# Patient Record
Sex: Female | Born: 1940 | Race: White | Hispanic: No | Marital: Married | Smoking: Never smoker
Health system: Southern US, Community
[De-identification: ages and names within clinical notes are randomized; demographics above are authoritative.]

## PROBLEM LIST (undated history)

## (undated) DIAGNOSIS — K5000 Crohn's disease of small intestine without complications: Secondary | ICD-10-CM

## (undated) DIAGNOSIS — R011 Cardiac murmur, unspecified: Secondary | ICD-10-CM

## (undated) DIAGNOSIS — Z9289 Personal history of other medical treatment: Secondary | ICD-10-CM

## (undated) DIAGNOSIS — I639 Cerebral infarction, unspecified: Secondary | ICD-10-CM

## (undated) DIAGNOSIS — R42 Dizziness and giddiness: Secondary | ICD-10-CM

## (undated) DIAGNOSIS — C439 Malignant melanoma of skin, unspecified: Secondary | ICD-10-CM

## (undated) DIAGNOSIS — R0789 Other chest pain: Secondary | ICD-10-CM

## (undated) DIAGNOSIS — I10 Essential (primary) hypertension: Secondary | ICD-10-CM

## (undated) HISTORY — DX: Personal history of other medical treatment: Z92.89

## (undated) HISTORY — PX: BASAL CELL CARCINOMA EXCISION: SHX1214

## (undated) HISTORY — PX: CARDIAC CATHETERIZATION: SHX172

## (undated) HISTORY — DX: Cerebral infarction, unspecified: I63.9

## (undated) HISTORY — DX: Other chest pain: R07.89

## (undated) HISTORY — DX: Dizziness and giddiness: R42

## (undated) HISTORY — DX: Cardiac murmur, unspecified: R01.1

---
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
## 1996-11-08 DIAGNOSIS — C439 Malignant melanoma of skin, unspecified: Secondary | ICD-10-CM

## 1996-11-08 HISTORY — DX: Malignant melanoma of skin, unspecified: C43.9

## 2013-10-17 DIAGNOSIS — I10 Essential (primary) hypertension: Secondary | ICD-10-CM | POA: Insufficient documentation

## 2016-01-23 ENCOUNTER — Ambulatory Visit: Payer: Self-pay | Admitting: Family Medicine

## 2016-03-29 ENCOUNTER — Other Ambulatory Visit: Payer: Self-pay | Admitting: Internal Medicine

## 2016-03-29 DIAGNOSIS — Z1239 Encounter for other screening for malignant neoplasm of breast: Secondary | ICD-10-CM

## 2016-03-29 DIAGNOSIS — R0789 Other chest pain: Secondary | ICD-10-CM

## 2016-03-29 DIAGNOSIS — K449 Diaphragmatic hernia without obstruction or gangrene: Secondary | ICD-10-CM | POA: Insufficient documentation

## 2016-03-29 DIAGNOSIS — K219 Gastro-esophageal reflux disease without esophagitis: Secondary | ICD-10-CM | POA: Insufficient documentation

## 2016-03-29 DIAGNOSIS — E7800 Pure hypercholesterolemia, unspecified: Secondary | ICD-10-CM | POA: Insufficient documentation

## 2016-04-15 ENCOUNTER — Other Ambulatory Visit: Payer: Self-pay | Admitting: Internal Medicine

## 2016-04-15 DIAGNOSIS — R0789 Other chest pain: Secondary | ICD-10-CM

## 2016-04-16 ENCOUNTER — Ambulatory Visit: Payer: Medicare Other

## 2016-04-29 ENCOUNTER — Ambulatory Visit
Admission: RE | Admit: 2016-04-29 | Discharge: 2016-04-29 | Disposition: A | Discharge status: Home / Self Care | Payer: Medicare Other | Source: Ambulatory Visit | Attending: Internal Medicine | Admitting: Internal Medicine

## 2016-04-29 DIAGNOSIS — K449 Diaphragmatic hernia without obstruction or gangrene: Secondary | ICD-10-CM | POA: Diagnosis not present

## 2016-04-29 DIAGNOSIS — R0789 Other chest pain: Secondary | ICD-10-CM | POA: Diagnosis present

## 2016-04-29 HISTORY — DX: Malignant melanoma of skin, unspecified: C43.9

## 2016-04-29 HISTORY — DX: Essential (primary) hypertension: I10

## 2016-04-29 MED ORDER — IOPAMIDOL (ISOVUE-370) INJECTION 76%
100.0000 mL | Freq: Once | INTRAVENOUS | Status: AC | PRN
  Administered 2016-04-29: 100 mL via INTRAVENOUS

## 2016-04-30 ENCOUNTER — Ambulatory Visit
Admission: RE | Admit: 2016-04-30 | Discharge: 2016-04-30 | Disposition: A | Discharge status: Home / Self Care | Payer: Medicare Other | Source: Ambulatory Visit | Attending: Internal Medicine | Admitting: Internal Medicine

## 2016-04-30 ENCOUNTER — Other Ambulatory Visit: Payer: Self-pay | Admitting: Internal Medicine

## 2016-04-30 DIAGNOSIS — Z1239 Encounter for other screening for malignant neoplasm of breast: Secondary | ICD-10-CM

## 2016-04-30 DIAGNOSIS — Z1231 Encounter for screening mammogram for malignant neoplasm of breast: Secondary | ICD-10-CM

## 2016-08-07 DIAGNOSIS — R7303 Prediabetes: Secondary | ICD-10-CM | POA: Insufficient documentation

## 2016-10-21 ENCOUNTER — Other Ambulatory Visit: Payer: Self-pay | Admitting: Internal Medicine

## 2016-10-21 DIAGNOSIS — R1013 Epigastric pain: Secondary | ICD-10-CM

## 2016-10-27 ENCOUNTER — Ambulatory Visit: Payer: Medicare Other

## 2017-05-31 DIAGNOSIS — M8589 Other specified disorders of bone density and structure, multiple sites: Secondary | ICD-10-CM | POA: Insufficient documentation

## 2017-06-01 ENCOUNTER — Other Ambulatory Visit: Payer: Self-pay | Admitting: Internal Medicine

## 2017-06-01 DIAGNOSIS — Z1231 Encounter for screening mammogram for malignant neoplasm of breast: Secondary | ICD-10-CM

## 2017-06-15 ENCOUNTER — Other Ambulatory Visit: Payer: Self-pay | Admitting: *Deleted

## 2017-06-15 ENCOUNTER — Ambulatory Visit
Admission: RE | Admit: 2017-06-15 | Discharge: 2017-06-15 | Disposition: A | Discharge status: Home / Self Care | Payer: Medicare Other | Source: Ambulatory Visit | Attending: Internal Medicine | Admitting: Internal Medicine

## 2017-06-15 ENCOUNTER — Inpatient Hospital Stay
Admission: RE | Admit: 2017-06-15 | Discharge: 2017-06-15 | Disposition: A | Discharge status: Home / Self Care | Payer: Self-pay | Source: Ambulatory Visit | Attending: *Deleted | Admitting: *Deleted

## 2017-06-15 DIAGNOSIS — Z1231 Encounter for screening mammogram for malignant neoplasm of breast: Secondary | ICD-10-CM | POA: Insufficient documentation

## 2017-06-15 DIAGNOSIS — Z9289 Personal history of other medical treatment: Secondary | ICD-10-CM

## 2017-09-26 DIAGNOSIS — M810 Age-related osteoporosis without current pathological fracture: Secondary | ICD-10-CM | POA: Insufficient documentation

## 2018-01-24 DIAGNOSIS — H25812 Combined forms of age-related cataract, left eye: Secondary | ICD-10-CM | POA: Insufficient documentation

## 2018-01-24 DIAGNOSIS — Z9889 Other specified postprocedural states: Secondary | ICD-10-CM | POA: Insufficient documentation

## 2018-04-10 ENCOUNTER — Ambulatory Visit
Admission: RE | Admit: 2018-04-10 | Discharge: 2018-04-10 | Disposition: A | Discharge status: Home / Self Care | Payer: Medicare Other | Source: Ambulatory Visit | Attending: Internal Medicine | Admitting: Internal Medicine

## 2018-04-10 ENCOUNTER — Other Ambulatory Visit: Payer: Self-pay | Admitting: Internal Medicine

## 2018-04-10 DIAGNOSIS — D259 Leiomyoma of uterus, unspecified: Secondary | ICD-10-CM | POA: Diagnosis not present

## 2018-04-10 DIAGNOSIS — R1033 Periumbilical pain: Secondary | ICD-10-CM | POA: Diagnosis not present

## 2018-04-10 DIAGNOSIS — K5730 Diverticulosis of large intestine without perforation or abscess without bleeding: Secondary | ICD-10-CM | POA: Insufficient documentation

## 2018-04-10 DIAGNOSIS — K5000 Crohn's disease of small intestine without complications: Secondary | ICD-10-CM | POA: Insufficient documentation

## 2018-04-10 DIAGNOSIS — M47896 Other spondylosis, lumbar region: Secondary | ICD-10-CM | POA: Insufficient documentation

## 2018-04-10 DIAGNOSIS — K7689 Other specified diseases of liver: Secondary | ICD-10-CM | POA: Diagnosis not present

## 2018-04-10 DIAGNOSIS — R1032 Left lower quadrant pain: Secondary | ICD-10-CM

## 2018-04-10 DIAGNOSIS — Q7649 Other congenital malformations of spine, not associated with scoliosis: Secondary | ICD-10-CM | POA: Insufficient documentation

## 2018-04-10 DIAGNOSIS — K6389 Other specified diseases of intestine: Secondary | ICD-10-CM | POA: Insufficient documentation

## 2018-04-10 HISTORY — DX: Crohn's disease of small intestine without complications: K50.00

## 2018-04-13 ENCOUNTER — Other Ambulatory Visit: Payer: Self-pay

## 2018-04-13 ENCOUNTER — Emergency Department: Payer: Medicare Other

## 2018-04-13 ENCOUNTER — Emergency Department
Admission: EM | Admit: 2018-04-13 | Discharge: 2018-04-13 | Disposition: A | Discharge status: Home / Self Care | Payer: Medicare Other | Attending: Student in an Organized Health Care Education/Training Program | Admitting: Student in an Organized Health Care Education/Training Program

## 2018-04-13 ENCOUNTER — Encounter: Payer: Self-pay | Admitting: Emergency Medicine

## 2018-04-13 DIAGNOSIS — M79605 Pain in left leg: Secondary | ICD-10-CM

## 2018-04-13 DIAGNOSIS — M1990 Unspecified osteoarthritis, unspecified site: Secondary | ICD-10-CM | POA: Insufficient documentation

## 2018-04-13 DIAGNOSIS — M25452 Effusion, left hip: Secondary | ICD-10-CM | POA: Diagnosis not present

## 2018-04-13 DIAGNOSIS — I10 Essential (primary) hypertension: Secondary | ICD-10-CM | POA: Diagnosis not present

## 2018-04-13 DIAGNOSIS — M25552 Pain in left hip: Secondary | ICD-10-CM | POA: Diagnosis present

## 2018-04-13 LAB — CBC WITH DIFFERENTIAL/PLATELET
Basophils Absolute: 0 10*3/uL (ref 0–0.1)
Basophils Relative: 0 %
Eosinophils Absolute: 0 10*3/uL (ref 0–0.7)
Eosinophils Relative: 0 %
HCT: 36.3 % (ref 35.0–47.0)
Hemoglobin: 12.6 g/dL (ref 12.0–16.0)
Lymphocytes Relative: 10 %
Lymphs Abs: 1.1 10*3/uL (ref 1.0–3.6)
MCH: 32.4 pg (ref 26.0–34.0)
MCHC: 34.7 g/dL (ref 32.0–36.0)
MCV: 93.3 fL (ref 80.0–100.0)
Monocytes Absolute: 1.1 10*3/uL — ABNORMAL HIGH (ref 0.2–0.9)
Monocytes Relative: 10 %
Neutro Abs: 9 10*3/uL — ABNORMAL HIGH (ref 1.4–6.5)
Neutrophils Relative %: 80 %
Platelets: 280 10*3/uL (ref 150–440)
RBC: 3.89 MIL/uL (ref 3.80–5.20)
RDW: 13 % (ref 11.5–14.5)
WBC: 11.3 10*3/uL — ABNORMAL HIGH (ref 3.6–11.0)

## 2018-04-13 LAB — SYNOVIAL CELL COUNT + DIFF, W/ CRYSTALS
Crystals, Fluid: NONE SEEN
Eosinophils-Synovial: 0 %
Lymphocytes-Synovial Fld: 0 %
Monocyte-Macrophage-Synovial Fluid: 15 %
Neutrophil, Synovial: 85 %
WBC, Synovial: 29169 /mm3 — ABNORMAL HIGH (ref 0–200)

## 2018-04-13 LAB — BASIC METABOLIC PANEL
Anion gap: 10 (ref 5–15)
BUN: 8 mg/dL (ref 6–20)
CO2: 19 mmol/L — ABNORMAL LOW (ref 22–32)
Calcium: 8.5 mg/dL — ABNORMAL LOW (ref 8.9–10.3)
Chloride: 103 mmol/L (ref 101–111)
Creatinine, Ser: 0.8 mg/dL (ref 0.44–1.00)
GFR calc Af Amer: >60 mL/min (ref >60)
GFR calc non Af Amer: >60 mL/min (ref >60)
Glucose, Bld: 138 mg/dL — ABNORMAL HIGH (ref 65–99)
Potassium: 3.4 mmol/L — ABNORMAL LOW (ref 3.5–5.1)
Sodium: 132 mmol/L — ABNORMAL LOW (ref 135–145)

## 2018-04-13 LAB — SEDIMENTATION RATE: Sed Rate: 59 mm/hr — ABNORMAL HIGH (ref 0–30)

## 2018-04-13 MED ORDER — LORAZEPAM 2 MG/ML IJ SOLN
INTRAMUSCULAR | Status: AC
  Filled 2018-04-13: qty 1

## 2018-04-13 MED ORDER — DIAZEPAM 5 MG/ML IJ SOLN: 2.0000 mg | Freq: Once | INTRAMUSCULAR | Status: DC

## 2018-04-13 MED ORDER — TRAMADOL HCL 50 MG PO TABS: 50.0000 mg | ORAL_TABLET | Freq: Four times a day (QID) | ORAL | 0 refills | Status: DC | PRN

## 2018-04-13 MED ORDER — LIDOCAINE HCL (PF) 1 % IJ SOLN: 10.0000 mL | Freq: Once | INTRAMUSCULAR | Status: DC

## 2018-04-13 MED ORDER — PREDNISONE 20 MG PO TABS
60.0000 mg | ORAL_TABLET | Freq: Once | ORAL | Status: AC
  Administered 2018-04-13: 60 mg via ORAL
  Filled 2018-04-13: qty 6

## 2018-04-13 MED ORDER — LORAZEPAM 2 MG/ML IJ SOLN
0.5000 mg | Freq: Once | INTRAMUSCULAR | Status: AC
  Administered 2018-04-13: 0.5 mg via INTRAVENOUS

## 2018-04-13 MED ORDER — FENTANYL CITRATE (PF) 100 MCG/2ML IJ SOLN
25.0000 ug | INTRAMUSCULAR | Status: DC | PRN
  Administered 2018-04-13 (×4): 25 ug via INTRAVENOUS
  Filled 2018-04-13 (×4): qty 2

## 2018-04-13 MED ORDER — LIDOCAINE 5 % EX PTCH
1.0000 | MEDICATED_PATCH | CUTANEOUS | Status: DC
  Administered 2018-04-13: 1 via TRANSDERMAL
  Filled 2018-04-13: qty 1

## 2018-04-13 NOTE — ED Notes (Signed)
Patient transported to X-ray 

## 2018-04-13 NOTE — ED Notes (Signed)
Reviewed d/c instructions with pt and husband and pt understands all instructions. Pt states "I just want to get out of here, you all have done nothing for me." RN explained her plan of care and that she needs to f/u with an orthopedic and one was listed on her d/c instructions. Pt states that she is not happy with the pain meds she was given for home and this RN offered to speak with the MD to get her something different or to come discuss with the pt. The pt states "no its fine, I'll just suffer through it all." Offered for pt to speak with MD about her concerns and she declined. Offered to wheel pt to [place] and she declined. Pt was wheeled out to [place] by husband.

## 2018-04-13 NOTE — ED Provider Notes (Signed)
[facility] Emergency Department Provider Note    First MD Initiated Contact with Patient 04/13/18 [phone]     (approximate)  I have reviewed the triage vital signs and the nursing notes.   HISTORY  Chief Complaint Hip Pain    HPI Madison Direnzo is a 77 y.o. female presents to the ER with chief complaint of progressively worsening left hip pain.  Patient recently diagnosed with Crohn's and was given pain medication she been having pain initially in her shoulders but has primarily seated in her left hip.  Patient having difficulty walking secondary to the pain.  States that the pain medication she was prescribed by her physician are not helping.  Denies any fevers.  No known history of rheumatoid arthritis.  Did have CT imaging done this week that showed evidence of probable inflammatory or possible infectious colitis but normal read of the hips.  Denies any interval trauma.    Past Medical History:  Diagnosis Date  . Hypertension   . Melanoma ([place]) 1998   Resected at [place]   No family history on file. History reviewed. No pertinent surgical history. There are no active problems to display for this patient.     Prior to Admission medications   Not on File    Allergies Penicillins    Social History Social History   Tobacco Use  . Smoking status: Never Smoker  . Smokeless tobacco: Never Used  Substance Use Topics  . Alcohol use: Not on file  . Drug use: Not on file    Review of Systems Patient denies headaches, rhinorrhea, blurry vision, numbness, shortness of breath, chest pain, edema, cough, abdominal pain, nausea, vomiting, diarrhea, dysuria, fevers, rashes or hallucinations unless otherwise stated above in HPI. ____________________________________________   PHYSICAL EXAM:  VITAL SIGNS: Vitals:   04/13/18 0915 04/13/18 0930  BP:  134/78  Pulse: 94 69  Resp: (!) 24 (!) 25  Temp:    SpO2: 99% 93%    Constitutional: Alert and  oriented.  Eyes: Conjunctivae are normal.  Head: Atraumatic. Nose: No congestion/rhinnorhea. Mouth/Throat: Mucous membranes are moist.   Neck: No stridor. Painless ROM.  Cardiovascular: Normal rate, regular rhythm. Grossly normal heart sounds.  Good peripheral circulation. Respiratory: Normal respiratory effort.  No retractions. Lungs CTAB. Gastrointestinal: Soft and nontender. No distention. No abdominal bruits. No CVA tenderness. Genitourinary:  Musculoskeletal: No obvious deformity but there is pain with passive and active range of motion of the left hip.  + pain with log roll Neurologic:  Normal speech and language. No gross focal neurologic deficits are appreciated. No facial droop Skin:  Skin is warm, dry and intact. No rash noted. Psychiatric: Mood and affect are normal. Speech and behavior are normal.  ____________________________________________   LABS (all labs ordered are listed, but only abnormal results are displayed)  Results for orders placed or performed during the hospital encounter of 04/13/18 (from the past 24 hour(s))  CBC with Differential/Platelet     Status: Abnormal   Collection Time: 04/13/18  9:19 AM  Result Value Ref Range   WBC 11.3 (H) 3.6 - 11.0 K/uL   RBC 3.89 3.80 - 5.20 MIL/uL   Hemoglobin 12.6 12.0 - 16.0 g/dL   HCT 36.3 35.0 - 47.0 %   MCV 93.3 80.0 - 100.0 fL   MCH 32.4 26.0 - 34.0 pg   MCHC 34.7 32.0 - 36.0 g/dL   RDW 13.0 11.5 - 14.5 %   Platelets 280 150 - 440 K/uL  Neutrophils Relative % 80 %   Neutro Abs 9.0 (H) 1.4 - 6.5 K/uL   Lymphocytes Relative 10 %   Lymphs Abs 1.1 1.0 - 3.6 K/uL   Monocytes Relative 10 %   Monocytes Absolute 1.1 (H) 0.2 - 0.9 K/uL   Eosinophils Relative 0 %   Eosinophils Absolute 0.0 0 - 0.7 K/uL   Basophils Relative 0 %   Basophils Absolute 0.0 0 - 0.1 K/uL  Basic metabolic panel     Status: Abnormal   Collection Time: 04/13/18  9:19 AM  Result Value Ref Range   Sodium 132 (L) 135 - 145 mmol/L    Potassium 3.4 (L) 3.5 - 5.1 mmol/L   Chloride 103 101 - 111 mmol/L   CO2 19 (L) 22 - 32 mmol/L   Glucose, Bld 138 (H) 65 - 99 mg/dL   BUN 8 6 - 20 mg/dL   Creatinine, Ser 0.80 0.44 - 1.00 mg/dL   Calcium 8.5 (L) 8.9 - 10.3 mg/dL   GFR calc non Af Amer >60 >60 mL/min   GFR calc Af Amer >60 >60 mL/min   Anion gap 10 5 - 15  Sedimentation rate     Status: Abnormal   Collection Time: 04/13/18  9:19 AM  Result Value Ref Range   Sed Rate 59 (H) 0 - 30 mm/hr   ____________________________________________  EKG My review and personal interpretation at Time: 6:45   Indication: hip pain  Rate: 100  Rhythm: sinus Axis: normal Other: normal intervals, no stemi ____________________________________________  RADIOLOGY  I personally reviewed all radiographic images ordered to evaluate for the above acute complaints and reviewed radiology reports and findings.  These findings were personally discussed with the patient.  Please see medical record for radiology report.  ____________________________________________   PROCEDURES  Procedure(s) performed:  Procedures    Critical Care performed: no ____________________________________________   INITIAL IMPRESSION / ASSESSMENT AND PLAN / ED COURSE  Pertinent labs & imaging results that were available during my care of the patient were reviewed by me and considered in my medical decision making (see chart for details).   DDX: fracture, inflammatory arthritis, infectious arthritis, ligamentous tear, RA  Roberta Angell is a 77 y.o. who presents to the ED with symptoms as described above.  Patient has had extensive work-up past few weeks.  Also with recent diagnosis of probable Crohn's and given her worsening left hip pain I do have high suspicion for inflammatory arthritis but given her pain cannot exclude fracture therefore will order MRI to further evaluate.  ESR and CBC are also mildly elevated but fortunately patient is not febrile.  Will  provide IV pain medication.  Clinical Course as of Apr 13 1624  Thu Apr 13, 2018  1314 I do spoke with Dr. Harlow Mares regarding the patient's presentation and he has recommended IR arthrocentesis to evaluate for infectious versus inflammatory arthritis given her elevated inflammatory markers.   [PR]  1607 Patient reassessed.  States pain is improving.  Spoke with lab tech who states that the prelim cell count was 29,000.  Will confirm.   [PR]  1622 Discussed case with Dr. Harlow Mares who agrees to see patient in clinic pending that formal cell count and differential is an inflammatory arthritis range.  Patient will be started on prednisone.  Have discussed with the patient and available family all diagnostics and treatments performed thus far and all questions were answered to the best of my ability. The patient demonstrates understanding and agreement with plan.    [  PR]    Clinical Course User Index [PR] Merlyn Lot, MD     As part of my medical decision making, I reviewed the following data within the [place] notes reviewed and incorporated, Labs reviewed, notes from prior ED visits.   ____________________________________________   FINAL CLINICAL IMPRESSION(S) / ED DIAGNOSES  Final diagnoses:  Left leg pain  Hip joint effusion, left  Arthritis      NEW MEDICATIONS STARTED DURING THIS VISIT:  New Prescriptions   No medications on file     Note:  This document was prepared using Dragon voice recognition software and may include unintentional dictation errors.    Merlyn Lot, MD 04/13/18 [phone]

## 2018-04-13 NOTE — ED Notes (Signed)
Pt up to bedside commode with 1x assist. Pt complains of hip pain of "10/10". Dr. Quentin Cornwall notified.

## 2018-04-13 NOTE — ED Notes (Signed)
Resumed care from [place], [place]. Pt states she began having hip and shoulder blade pain since Sunday. She reports that the pain has progressed to her groin.

## 2018-04-13 NOTE — Discharge Instructions (Addendum)
Please take your medications as prescribed.  Return immediately should you develop any fever, worsening pain, or for any additional questions or concerns.  Follow-up with Dr. Harlow Mares clinic.  You can call to make an appointment tomorrow morning for an appointment first thing next week.

## 2018-04-13 NOTE — ED Notes (Signed)
Pt up to commode in room. Pt states "pain is still 9/10 and keeps getting worse the longer is sit here." Pt waiting on MRI. Dr. Quentin Cornwall notified.

## 2018-04-13 NOTE — ED Triage Notes (Signed)
Pt to triage via w/c, appears uncomfortable; c/o left hip pain radiating into groin; denies injury; st seen for same on Sunday and dx with inflammatory arthritis

## 2018-04-13 NOTE — ED Provider Notes (Signed)
-----------------------------------------   6:02 PM on 04/13/2018 -----------------------------------------  Was signed out to me at this time, patient was with a hip effusion, it was tapped.  Verbal read back to Dr. Quentin Cornwall was 29,000 but he wanted to make sure that it was verified before sending the patient home also he wanted to make sure that orthopedic surgeon was okay with that result.  I did discuss with Dr. Harlow Mares, we talked about the patient's finding he was already aware of the patient, all her vital signs, blood work etc., he feels a white count 29,000 does not indicate need for further care in the emergency department he will see the patient closely as an outpatient.  Patient and family are eager to go home we will send her home with pain medication and close outpatient follow-up with the pubic surgery, culture has been sent this is the plan from prior doctor and orthopedic surgery.  Return precautions for fever or increased pain given understood, they will follow-up with Dr. Harlow Mares tomorrow.   Schuyler Amor, MD 04/13/18 [phone]

## 2018-04-14 LAB — RHEUMATOID FACTOR: Rhuematoid fact SerPl-aCnc: 21 IU/mL — ABNORMAL HIGH (ref 0.0–13.9)

## 2018-04-17 LAB — BODY FLUID CULTURE: Culture: NO GROWTH

## 2018-04-26 DIAGNOSIS — R768 Other specified abnormal immunological findings in serum: Secondary | ICD-10-CM | POA: Insufficient documentation

## 2018-05-24 ENCOUNTER — Other Ambulatory Visit: Payer: Self-pay | Admitting: Internal Medicine

## 2018-05-24 DIAGNOSIS — Z1231 Encounter for screening mammogram for malignant neoplasm of breast: Secondary | ICD-10-CM

## 2018-06-19 ENCOUNTER — Ambulatory Visit
Admission: RE | Admit: 2018-06-19 | Discharge: 2018-06-19 | Disposition: A | Discharge status: Home / Self Care | Payer: Medicare Other | Source: Ambulatory Visit | Attending: Internal Medicine | Admitting: Internal Medicine

## 2018-06-19 DIAGNOSIS — Z1231 Encounter for screening mammogram for malignant neoplasm of breast: Secondary | ICD-10-CM

## 2019-04-05 ENCOUNTER — Ambulatory Visit: Payer: Self-pay

## 2019-04-05 NOTE — Telephone Encounter (Signed)
Returned call to pt.  Stated she has had intermittent dizziness since May 1st.  Reported the "top of her head feels heavy", and she "feels a little off balance".  Also reported feeling lethargic at times.  Reported the dizziness is moderate, but can be severe at times.  Reported she can tell her distance vision has changed with current glasses; has future appt. With Ophthalmologist.  Denied weakness/ numbness of extremities, facial droop, or speech problems.  Pulse rated at 12:42 is 95 per Fitbit.  Reported at 7:00 AM, BP 133/73, pulse rate 70.  Stated she has intermittent chest pain, mid chest that "lasts only a minute or so", if she over exerts.  Stated she has noted radiation from mid chest to right jaw and right ear before.  Denied any chest pain at this time.  Also reported mild shortness of breath if she over exerts by going up and down stairs, repetitively, or working in yard.    Has appt. 6/24 to establish care with Raelyn Ensign, NP.  Called FC at office.  New pt./ establish care appt. Was moved up to 04/13/19.  Advised that pt. Should go to UC or the ER today for evaluation of her current symptoms.  Advised against her waiting to have the dizziness/ chest discomfort evaluated at later date.  Pt. Verb. Understanding; agreed.   Reason for Disposition . [1] MODERATE dizziness (e.g., interferes with normal activities) AND [2] has NOT been evaluated by physician for this  (Exception: dizziness caused by heat exposure, sudden standing, or poor fluid intake)  Answer Assessment - Initial Assessment Questions 1. DESCRIPTION: "Describe your dizziness."     "Top of head feels heavy and feels lethargic" 2. LIGHTHEADED: "Do you feel lightheaded?" (e.g., somewhat faint, woozy, weak upon standing)     Feels a little off balance; denied feeling faint 3. VERTIGO: "Do you feel like either you or the room is spinning or tilting?" (i.e. vertigo)     Denied room spinning  4. SEVERITY: "How bad is it?"  "Do you  feel like you are going to faint?" "Can you stand and walk?"   - MILD - walking normally   - MODERATE - interferes with normal activities (e.g., work, school)    - SEVERE - unable to stand, requires support to walk, feels like passing out now.      "Most of time it is moderate"  "I feel like it is severe sometimes." 5. ONSET:  "When did the dizziness begin?"     About May 1st. 6. AGGRAVATING FACTORS: "Does anything make it worse?" (e.g., standing, change in head position)     Bending over or sudden change in direction  7. HEART RATE: "Can you tell me your heart rate?" "How many beats in 15 seconds?"  (Note: not all patients can do this)       95 per Fitbit at 12:42 PM (reported she just finished eating);  BP 133/73, P. 70 at 7:00 AM today  8. CAUSE: "What do you think is causing the dizziness?"    Unknown  9. RECURRENT SYMPTOM: "Have you had dizziness before?" If so, ask: "When was the last time?" "What happened that time?"     Hx of Labrynthitis.   10. OTHER SYMPTOMS: "Do you have any other symptoms?" (e.g., fever, chest pain, vomiting, diarrhea, bleeding)       Feels her distance vision has changed with current glasses; denied headaches, denied weakness or numbness in extremities , denied speech changes, denied  facial droop.  Takes Allegra for allergies. Stated short of breath with activity of repeated going up and down stairs, or working out in yard; reported intermittent light chest pain in mid chest; lasts only a minute or so, if she over-exerts herself.  Sometimes it radiates to right jaw and right ear.  11. PREGNANCY: "Is there any chance you are pregnant?" "When was your last menstrual period?"       N/a  Protocols used: DIZZINESS [facility]  Message from Nils Flack sent at 04/05/2019 11:59 AM EDT   Summary: dizzines    Pt has been dizzy daily since 03/09/19.  She has had nausea at times also since the same time. Pt has new pt appt on 05/02/19, wants to know if  she needs to be sooner.  cb is [phone]

## 2019-04-08 ENCOUNTER — Emergency Department
Admission: EM | Admit: 2019-04-08 | Discharge: 2019-04-08 | Disposition: A | Discharge status: Home / Self Care | Payer: Medicare Other | Attending: Emergency Medicine | Admitting: Emergency Medicine

## 2019-04-08 ENCOUNTER — Emergency Department: Payer: Medicare Other

## 2019-04-08 ENCOUNTER — Encounter: Payer: Self-pay | Admitting: Emergency Medicine

## 2019-04-08 ENCOUNTER — Other Ambulatory Visit: Payer: Self-pay

## 2019-04-08 DIAGNOSIS — I10 Essential (primary) hypertension: Secondary | ICD-10-CM | POA: Insufficient documentation

## 2019-04-08 DIAGNOSIS — R42 Dizziness and giddiness: Secondary | ICD-10-CM | POA: Diagnosis not present

## 2019-04-08 DIAGNOSIS — R0789 Other chest pain: Secondary | ICD-10-CM | POA: Diagnosis present

## 2019-04-08 LAB — BASIC METABOLIC PANEL
Anion gap: 12 (ref 5–15)
BUN: 14 mg/dL (ref 8–23)
CO2: 20 mmol/L — ABNORMAL LOW (ref 22–32)
Calcium: 9.2 mg/dL (ref 8.9–10.3)
Chloride: 105 mmol/L (ref 98–111)
Creatinine, Ser: 0.79 mg/dL (ref 0.44–1.00)
GFR calc Af Amer: >60 mL/min (ref >60)
GFR calc non Af Amer: >60 mL/min (ref >60)
Glucose, Bld: 104 mg/dL — ABNORMAL HIGH (ref 70–99)
Potassium: 3.6 mmol/L (ref 3.5–5.1)
Sodium: 137 mmol/L (ref 135–145)

## 2019-04-08 LAB — CBC
HCT: 39.6 % (ref 36.0–46.0)
Hemoglobin: 13.6 g/dL (ref 12.0–15.0)
MCH: 31.8 pg (ref 26.0–34.0)
MCHC: 34.3 g/dL (ref 30.0–36.0)
MCV: 92.5 fL (ref 80.0–100.0)
Platelets: 288 10*3/uL (ref 150–400)
RBC: 4.28 MIL/uL (ref 3.87–5.11)
RDW: 12.9 % (ref 11.5–15.5)
WBC: 6.6 10*3/uL (ref 4.0–10.5)
nRBC: 0 % (ref 0.0–0.2)

## 2019-04-08 LAB — TROPONIN I
Troponin I: <0.03 ng/mL (ref <0.03)
Troponin I: <0.03 ng/mL (ref <0.03)

## 2019-04-08 MED ORDER — DIAZEPAM 2 MG PO TABS
2.0000 mg | ORAL_TABLET | Freq: Once | ORAL | Status: AC
  Administered 2019-04-08: 2 mg via ORAL
  Filled 2019-04-08: qty 1

## 2019-04-08 MED ORDER — MECLIZINE HCL 25 MG PO TABS
25.0000 mg | ORAL_TABLET | Freq: Once | ORAL | Status: AC
  Administered 2019-04-08: 25 mg via ORAL
  Filled 2019-04-08: qty 1

## 2019-04-08 MED ORDER — MECLIZINE HCL 25 MG PO TABS: 25.0000 mg | ORAL_TABLET | Freq: Three times a day (TID) | ORAL | 0 refills | Status: DC | PRN

## 2019-04-08 NOTE — Discharge Instructions (Addendum)
The test we did here today are all okay.  Please return if you have more chest pressure that lasts 10 or 15 minutes or more.  Please follow-up with cardiology.  Dr. Neoma Laming is on call, he is very good, or you can see Dr. Rockey Situ since you asked me for his name particularly I have given you his contact information.  He is also see Dr. Kathyrn Sheriff, ENT, for the vertigo symptoms return here if you are worse.  Take the Antivert or meclizine 1 pill 3 times a day to help with the vertigo.

## 2019-04-08 NOTE — ED Notes (Signed)
Pt back from ct and states that she went to the bathroom and started to feel dizzy, pt states that she has been having dizziness intermittently for the past 31 days, pt states that when she laid down she felt like the bed was rolling to the door, pt is breathing heavily but states that she does that when she gets anxious. Dr Cinda Quest aware and repeat ekg performed as well as a set of vital signs, all wnl. Pt asking about vertigo and requesting exercises to help her with vertigo if that is what is causing it

## 2019-04-08 NOTE — ED Notes (Signed)
Pt back from CT. Pending results.

## 2019-04-08 NOTE — ED Notes (Signed)
Pt requesting to be reevaluated by MD.

## 2019-04-08 NOTE — ED Triage Notes (Addendum)
Patient reports she woke with heavy feeling in her chest so she got up to read.  Reports she got shaky and vomited.  Patient reports having dizziness since May 1.

## 2019-04-08 NOTE — ED Notes (Signed)
Patient given up date regarding wait time. 

## 2019-04-08 NOTE — ED Notes (Signed)
RN to room to introduce self to pt. PT raised concerns that monitor alarmed, pt concerned that she thought her heart stopped beating. PT made aware that Dr. Cinda Quest will be by to reevaluate shortly. VSS. HR 89

## 2019-04-08 NOTE — ED Notes (Signed)
Patient very nervous after triage, attempted to calm patient.  Helped patient attempt to call husband, but unable to reach him.  Patient states she is going to walk to the call and talk with him, but unsure if she is going to return to be seen.

## 2019-04-08 NOTE — ED Provider Notes (Signed)
[facility] Emergency Department Provider Note   ____________________________________________   First MD Initiated Contact with Patient 04/08/19 [phone]     (approximate)  I have reviewed the triage vital signs and the nursing notes.   HISTORY  Chief Complaint Irregular Heart Beat    HPI Melinda Le is a 78 y.o. female patient reports she got up earlier this morning and had some chest heavy feeling.  She is also complaining of lightheadedness that comes and goes and is lasted since the beginning of May.  She has a sort of an uncomfortable feeling in the back of her head that goes up to the top of her head and the symptoms of the head discomfort and lightheadedness are worse if she tilts her head backwards.  She said she had some labyrinthitis a number of years ago that was made worse when she put her head backwards and this almost feels like it but not exactly.  She denies any spinning symptoms.  She reports after she came to the hospital early this morning she went out to visit her husband in the car and now there she had an episode of having dry heaves after which she felt somewhat better.         Past Medical History:  Diagnosis Date  . Hypertension   . Melanoma ([place]) 1998   Resected at [place]    There are no active problems to display for this patient.   History reviewed. No pertinent surgical history.  Prior to Admission medications   Medication Sig Start Date End Date Taking? Authorizing Provider  traMADol (ULTRAM) 50 MG tablet Take 1 tablet (50 mg total) by mouth every 6 (six) hours as needed. 04/13/18 04/13/19  Merlyn Lot, MD    Allergies Penicillins  Family History  Problem Relation Age of Onset  . Breast cancer Neg Hx     Social History Social History   Tobacco Use  . Smoking status: Never Smoker  . Smokeless tobacco: Never Used  Substance Use Topics  . Alcohol use: Not on file  . Drug use: Not on file    Review of  Systems  Constitutional: No fever/chills Eyes: No visual changes. ENT: No sore throat. Cardiovascular: Denies chest pain. Respiratory: Denies shortness of breath. Gastrointestinal: No abdominal pain.  No nausea, no vomiting.  No diarrhea.  No constipation. Genitourinary: Negative for dysuria. Musculoskeletal: Negative for back pain. Skin: Negative for rash. Neurological: Negative for headaches, focal weakness   ____________________________________________   PHYSICAL EXAM:  VITAL SIGNS: ED Triage Vitals  Enc Vitals Group     BP 04/08/19 0318 (!) 170/66     Pulse Rate 04/08/19 0318 78     Resp 04/08/19 0318 18     Temp 04/08/19 0318 97.9 F (36.6 C)     Temp Source 04/08/19 0318 Oral     SpO2 04/08/19 0318 100 %     Weight 04/08/19 0318 142 lb (64.4 kg)     Height 04/08/19 0318 5' 2"  (1.575 m)     Head Circumference --      Peak Flow --      Pain Score 04/08/19 0321 2     Pain Loc --      Pain Edu? --      Excl. in [place]? --     Constitutional: Alert and oriented. Well appearing and in no acute distress. Eyes: Conjunctivae are normal. PERRL. EOMI. fundi appear normal but they are somewhat difficult to see well Head:  Atraumatic. Nose: No congestion/rhinnorhea. Mouth/Throat: Mucous membranes are moist.  Oropharynx non-erythematous. Neck: No stridor.  Cardiovascular: Normal rate, regular rhythm. Grossly normal heart sounds.  Good peripheral circulation. Respiratory: Normal respiratory effort.  No retractions. Lungs CTAB. Gastrointestinal: Soft and nontender. No distention. No abdominal bruits. No CVA tenderness. Musculoskeletal: No lower extremity tenderness nor edema.   Neurologic:  Normal speech and language. No gross focal neurologic deficits are appreciated. No gait instability. Skin:  Skin is warm, dry and intact. No rash noted.   ____________________________________________   LABS (all labs ordered are listed, but only abnormal results are displayed)  Labs  Reviewed  BASIC METABOLIC PANEL - Abnormal; Notable for the following components:      Result Value   CO2 20 (*)    Glucose, Bld 104 (*)    All other components within normal limits  CBC  TROPONIN I  TROPONIN I   ____________________________________________  EKG  KG read and interpreted by me shows normal sinus rhythm 82 normal axis no acute changes ____________________________________________  RADIOLOGY  ED MD interpretation: X-ray read by radiology reviewed by me is negative  Official radiology report(s): Dg Chest 2 View  Result Date: 04/08/2019 CLINICAL DATA:  Initial evaluation for acute chest pain. EXAM: CHEST - 2 VIEW COMPARISON:  Prior CT from 04/29/2016. FINDINGS: The cardiac and mediastinal silhouettes are stable in size and contour, and remain within normal limits. Aortic atherosclerosis. The lungs are normally inflated. Mild left basilar atelectasis and/or scarring. No airspace consolidation, pleural effusion, or pulmonary edema is identified. There is no pneumothorax. No acute osseous abnormality identified. IMPRESSION: 1. Mild left basilar atelectasis and/or scarring. No other active cardiopulmonary disease. 2. Aortic atherosclerosis. Electronically Signed   By: Jeannine Boga M.D.   On: 04/08/2019 06:29   Ct Head Wo Contrast  Result Date: 04/08/2019 CLINICAL DATA:  Dizziness EXAM: CT HEAD WITHOUT CONTRAST TECHNIQUE: Contiguous axial images were obtained from the base of the skull through the vertex without intravenous contrast. COMPARISON:  None. FINDINGS: Brain: No mass effect, midline shift, or acute intracranial hemorrhage. The ventricular system and extra-axial space are within normal limits. No suspicious focal area of low densities to suggest acute stroke. Vascular: No hyperdense vessel or unexpected calcification. Skull: Intact. Sinuses/Orbits: Visualized paranasal sinuses and mastoid air cells are clear. Other: Noncontributory. IMPRESSION: No acute intracranial  pathology. Electronically Signed   By: Marybelle Killings M.D.   On: 04/08/2019 10:03    ____________________________________________   PROCEDURES  Procedure(s) performed (including Critical Care):  Procedures   ____________________________________________   INITIAL IMPRESSION / ASSESSMENT AND PLAN / ED COURSE Patient's exam lab work CT of the head etc. are all okay.  Pressures come down some.  I will let her go have her follow-up with cardiology and also her regular doctor.              ____________________________________________   FINAL CLINICAL IMPRESSION(S) / ED DIAGNOSES  Final diagnoses:  Lightheadedness  Chest heaviness     ED Discharge Orders    None       Note:  This document was prepared using Dragon voice recognition software and may include unintentional dictation errors.    Nena Polio, MD 04/08/19 1010

## 2019-04-08 NOTE — ED Notes (Signed)
ED Provider at bedside. 

## 2019-04-13 ENCOUNTER — Ambulatory Visit (INDEPENDENT_AMBULATORY_CARE_PROVIDER_SITE_OTHER): Payer: Medicare Other | Admitting: Family Medicine

## 2019-04-13 ENCOUNTER — Other Ambulatory Visit: Payer: Self-pay

## 2019-04-13 ENCOUNTER — Telehealth: Payer: Self-pay | Admitting: Cardiovascular Disease

## 2019-04-13 ENCOUNTER — Encounter: Payer: Self-pay | Admitting: Family Medicine

## 2019-04-13 VITALS — BP 150/68 | HR 80 | Temp 98.6°F | Resp 14 | Ht 62.0 in | Wt 137.8 lb

## 2019-04-13 DIAGNOSIS — R42 Dizziness and giddiness: Secondary | ICD-10-CM | POA: Diagnosis not present

## 2019-04-13 DIAGNOSIS — E7800 Pure hypercholesterolemia, unspecified: Secondary | ICD-10-CM

## 2019-04-13 DIAGNOSIS — I700 Atherosclerosis of aorta: Secondary | ICD-10-CM

## 2019-04-13 DIAGNOSIS — I10 Essential (primary) hypertension: Secondary | ICD-10-CM

## 2019-04-13 DIAGNOSIS — R7303 Prediabetes: Secondary | ICD-10-CM | POA: Diagnosis not present

## 2019-04-13 DIAGNOSIS — R011 Cardiac murmur, unspecified: Secondary | ICD-10-CM

## 2019-04-13 MED ORDER — MECLIZINE HCL 25 MG PO TABS: 25.0000 mg | ORAL_TABLET | Freq: Three times a day (TID) | ORAL | 0 refills | Status: DC | PRN

## 2019-04-13 NOTE — Progress Notes (Signed)
Name: Melinda Le   MRN: [id number]    DOB: 1941-01-19   Date:04/16/2019       Progress Note  Subjective  Chief Complaint  Chief Complaint  Patient presents with  . Establish Care  . Follow-up    seen in ER  . Dizziness    HPI  PT presents to establish care and for the following concerns:  She describes lightheadedness episodes occurring intermittently since May 1. Typical episode - nausea, she describes lightheadedness that starts in the posterior neck and progresses to the entire head.  She has some mild orthostatic lightheadedness.  Sometimes has shakiness, hot flashes or chills, occasional palpitations; sometimes has shortness of breath with exertion.  Denis chest pain, vomiting or diarrhea, extremity weakness, confusion, slurred speech.  - Initially episodes would start at 10am and last for 3-4 hours.  Now they are lasting nearly the entire day.  She has attempted home epley maneuvers without relief.  - ER visit on 04/08/2019 - EKG normal, CBC and BMP were non-contributory, troponin was negative, chest Xray was non-contributory (does have aortic atherosclerosis), CT head was negative for acute abnormality.  No MRI was performed; give meclizine and valium.  She reports meclizine does not help. - She does not snore or gasp/cough during the night; she is feeling well rested most mornings.   - She has a fitbit that monitors her heart-rate and a few days ago she was woken from sleep feeling dizzy and "funny in the chest" - and the heart rate would not register on her fitbit - Has 2 heart murmurs - has cardiologist in [place], but has not been back since moving to [place].  She would like referral to Dr. Rockey Situ. - Has HTN: brings log of BP's over the last month which are grossly normal with a few spikes where she notes she was feeling particularly stressed.  She is currently taking amlodipine 2.5 mg QAM and 32m QPM - She sees ophthalmologist routinely, has appointment in about 10 days  for follow up regarding vision.  No dramatic vision changes, does require corrective lenses.   Patient Active Problem List   Diagnosis Date Noted  . 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 Elevated rheumatoid factor 04/26/2018  . Crohn's ileitis, without complications (HBronx 028/31/5176 . Combined forms of age-related cataract of left eye 01/24/2018  . Osteopenia of multiple sites 05/31/2017  . Prediabetes 08/07/2016  . Hiatal hernia with gastroesophageal reflux 03/29/2016  . Pure hypercholesterolemia 03/29/2016  . Essential (primary) hypertension 10/17/2013    No past surgical history on file.  Family History  Problem Relation Age of Onset  . Breast cancer Neg Hx     Social History   Socioeconomic History  . Marital status: Married    Spouse name: DElenore Rota . Number of children: 3  . Years of education: Not on file  . Highest education level: Not on file  Occupational History  . Not on file  Social Needs  . Financial resource strain: Not hard at all  . Food insecurity:    Worry: Never true    Inability: Never true  . Transportation needs:    Medical: No    Non-medical: No  Tobacco Use  . Smoking status: Never Smoker  . Smokeless tobacco: Never Used  Substance and Sexual Activity  . Alcohol use: Never    Frequency: Never  . Drug use: Never  . Sexual activity: Not Currently  Lifestyle  . Physical  activity:    Days per week: Not on file    Minutes per session: Not on file  . Stress: Not on file  Relationships  . Social connections:    Talks on phone: Not on file    Gets together: Not on file    Attends religious service: Not on file    Active member of club or organization: Not on file    Attends meetings of clubs or organizations: Not on file    Relationship status: Not on file  . Intimate partner violence:    Fear of current or ex partner: Not on file    Emotionally abused: Not on file    Physically abused: Not on  file    Forced sexual activity: Not on file  Other Topics Concern  . Not on file  Social History Narrative  . Not on file     Current Outpatient Medications:  .  amLODipine (NORVASC) 2.5 MG tablet, Take 2.5 mg by mouth. Take 1 tablet QAM, and Take 2 tablets QPM, Disp: , Rfl:  .  meclizine (ANTIVERT) 25 MG tablet, Take 1 tablet (25 mg total) by mouth 3 (three) times daily as needed for dizziness., Disp: 30 tablet, Rfl: 0  Allergies  Allergen Reactions  . Penicillins Hives    I personally reviewed active problem list, medication list, allergies, notes from last encounter, lab results with the patient/caregiver today.   ROS Ten systems reviewed and is negative except as mentioned in HPI  Objective  Vitals:   04/13/19 1123 04/13/19 1228  BP: (!) 170/80 (!) 150/68  Pulse: 80   Resp: 14   Temp: 98.6 F (37 C)   TempSrc: Oral   SpO2: 98%   Weight: 137 lb 12.8 oz (62.5 kg)    Patient notes that she consistently has white coat syndrome and suspects that is why her BP is elevated today. Body mass index is 25.2 kg/m.  Physical Exam Constitutional: Patient appears well-developed and well-nourished. No distress.  HENT: Head: Normocephalic and atraumatic. Ears: bilateral TMs with no erythema or effusion; Nose: Nose normal. Mouth/Throat: Oropharynx is clear and moist. No oropharyngeal exudate or tonsillar swelling.  Eyes: Conjunctivae and EOM are normal. No scleral icterus.  Pupils are equal, round, and reactive to light.  Neck: Normal range of motion. Neck supple. No JVD present. No thyromegaly present.  Cardiovascular: Normal rate, regular rhythm and normal heart sounds.  No murmur heard. No BLE edema. Pulmonary/Chest: Effort normal and breath sounds normal. No respiratory distress. Musculoskeletal: Normal range of motion, no joint effusions. No gross deformities Neurological: Pt is alert and oriented to person, place, and time. No cranial nerve deficit. Coordination, balance,  strength, speech and gait are normal.  Skin: Skin is warm and dry. No rash noted. No erythema.  Psychiatric: Patient has a normal mood and affect. behavior is normal. Judgment and thought content normal.  Assessment & Plan  1. Lightheadedness - Will refer to cardiology; consider referral to neurology if cleared from Cardiac standpoint.  Also consider possible anxiety as contributory cause. - Ambulatory referral to Cardiology - COMPLETE METABOLIC PANEL WITH GFR - CBC with Differential/Platelet  2. Essential (primary) hypertension - Ambulatory referral to Cardiology - COMPLETE METABOLIC PANEL WITH GFR  3. Pure hypercholesterolemia - Ambulatory referral to Cardiology - Lipid panel  4. Prediabetes - Ambulatory referral to Cardiology - Hemoglobin A1c - COMPLETE METABOLIC PANEL WITH GFR  5. Heart murmur - Ambulatory referral to Cardiology  6. Aortic atherosclerosis (HCC) - Lipid  panel

## 2019-04-13 NOTE — Telephone Encounter (Signed)
NP refused an evist with Dr. Rockey Situ referred by PCP, please advise if patient can have in office visit.

## 2019-04-13 NOTE — Patient Instructions (Addendum)
Please check your Blood Pressure each morning: - If above 140/90, recheck in 10 minutes after sitting and relaxing, if still above 140/90, take 2.57m Amlodipine. - If under 140/90, do not take your amlodipine.

## 2019-04-13 NOTE — Telephone Encounter (Signed)
It appears Dr Rockey Situ has opening at 11:40 am on 04/19/19 which is his DOD. See if patient can come in for in office visit that day.

## 2019-04-14 LAB — CBC WITH DIFFERENTIAL/PLATELET
Absolute Monocytes: 531 cells/uL (ref 200–950)
Basophils Absolute: 48 cells/uL (ref 0–200)
Basophils Relative: 0.7 %
Eosinophils Absolute: 21 cells/uL (ref 15–500)
Eosinophils Relative: 0.3 %
HCT: 42 % (ref 35.0–45.0)
Hemoglobin: 14.1 g/dL (ref 11.7–15.5)
Lymphs Abs: 1553 cells/uL (ref 850–3900)
MCH: 31.8 pg (ref 27.0–33.0)
MCHC: 33.6 g/dL (ref 32.0–36.0)
MCV: 94.8 fL (ref 80.0–100.0)
MPV: 11.4 fL (ref 7.5–12.5)
Monocytes Relative: 7.7 %
Neutro Abs: 4747 cells/uL (ref 1500–7800)
Neutrophils Relative %: 68.8 %
Platelets: 298 10*3/uL (ref 140–400)
RBC: 4.43 10*6/uL (ref 3.80–5.10)
RDW: 12.8 % (ref 11.0–15.0)
Total Lymphocyte: 22.5 %
WBC: 6.9 10*3/uL (ref 3.8–10.8)

## 2019-04-14 LAB — HEMOGLOBIN A1C
Hgb A1c MFr Bld: 5.5 % of total Hgb (ref <5.7)
Mean Plasma Glucose: 111 (calc)
eAG (mmol/L): 6.2 (calc)

## 2019-04-14 LAB — LIPID PANEL
Cholesterol: 253 mg/dL — ABNORMAL HIGH (ref <200)
HDL: 107 mg/dL (ref >=50)
LDL Cholesterol (Calc): 128 mg/dL (calc) — ABNORMAL HIGH
Non-HDL Cholesterol (Calc): 146 mg/dL (calc) — ABNORMAL HIGH (ref <130)
Total CHOL/HDL Ratio: 2.4 (calc) (ref <5.0)
Triglycerides: 79 mg/dL (ref <150)

## 2019-04-14 LAB — COMPLETE METABOLIC PANEL WITH GFR
AG Ratio: 2.2 (calc) (ref 1.0–2.5)
ALT: 16 U/L (ref 6–29)
AST: 16 U/L (ref 10–35)
Albumin: 4.8 g/dL (ref 3.6–5.1)
Alkaline phosphatase (APISO): 56 U/L (ref 37–153)
BUN: 13 mg/dL (ref 7–25)
CO2: 23 mmol/L (ref 20–32)
Calcium: 9.6 mg/dL (ref 8.6–10.4)
Chloride: 102 mmol/L (ref 98–110)
Creat: 0.87 mg/dL (ref 0.60–0.93)
GFR, Est African American: 75 mL/min/{1.73_m2} (ref >=60)
GFR, Est Non African American: 65 mL/min/{1.73_m2} (ref >=60)
Globulin: 2.2 g/dL (calc) (ref 1.9–3.7)
Glucose, Bld: 103 mg/dL — ABNORMAL HIGH (ref 65–99)
Potassium: 4.2 mmol/L (ref 3.5–5.3)
Sodium: 137 mmol/L (ref 135–146)
Total Bilirubin: 0.4 mg/dL (ref 0.2–1.2)
Total Protein: 7 g/dL (ref 6.1–8.1)

## 2019-04-16 ENCOUNTER — Encounter: Payer: Self-pay | Admitting: Family Medicine

## 2019-04-16 DIAGNOSIS — H16223 Keratoconjunctivitis sicca, not specified as Sjogren's, bilateral: Secondary | ICD-10-CM | POA: Insufficient documentation

## 2019-04-19 ENCOUNTER — Encounter: Payer: Self-pay | Admitting: Family Medicine

## 2019-05-02 ENCOUNTER — Other Ambulatory Visit: Payer: Self-pay | Admitting: Otolaryngology

## 2019-05-02 ENCOUNTER — Ambulatory Visit: Payer: Self-pay | Admitting: Family Medicine

## 2019-05-02 ENCOUNTER — Other Ambulatory Visit: Payer: Self-pay

## 2019-05-02 ENCOUNTER — Ambulatory Visit
Admission: RE | Admit: 2019-05-02 | Discharge: 2019-05-02 | Disposition: A | Discharge status: Home / Self Care | Payer: Medicare Other | Attending: Otolaryngology | Admitting: Otolaryngology

## 2019-05-02 ENCOUNTER — Ambulatory Visit
Admission: RE | Admit: 2019-05-02 | Discharge: 2019-05-02 | Disposition: A | Discharge status: Home / Self Care | Payer: Medicare Other | Source: Ambulatory Visit | Attending: Otolaryngology | Admitting: Otolaryngology

## 2019-05-02 DIAGNOSIS — R52 Pain, unspecified: Secondary | ICD-10-CM | POA: Diagnosis present

## 2019-05-06 NOTE — Progress Notes (Signed)
Cardiology Office Note  Date:  05/08/2019   ID:  Melinda Le, Melinda Le June 19, 1941, MRN [id number]  PCP:  Hubbard Hartshorn, FNP   Chief Complaint  Patient presents with  . other    Lightheadedness and hypertension. Meds reviewed verbally with pt.    HPI:  [name] is a 78 year old woman with past medical history of Anxiety No coronary artery disease, ejection fraction 65% noted on cardiac cath in September 2016. No significant valvular disease noted on echocardiogram in 2014 at [facility].  Referred by Raelyn Ensign for lightheadedness/dizziness, murmur   episodes occurring intermittently since May 1.  Typical episode she has nausea,  Starts when looking down, bending neck posterior neck and progresses to the entire head.   mild orthostatic lightheadedness.  Sometimes has shakiness, hot flashes or chills, occasional palpitations; sometimes has shortness of breath with exertion.   episodes would start at 10am and last for 3-4 hours.  Now they are lasting nearly the entire Melinda Le.    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 Found mold in house New glasses Having sinus drainage, little better on allegra  Recently seen in the emergency room Apr 08, 2019 for lightheadedness Woke up with sx, dizzy, legs shaky,  ER visit on 04/08/2019 - EKG normal, CBC and BMP were non-contributory, troponin was negative, chest Xray was non-contributory (does have aortic atherosclerosis), CT head was negative for acute abnormality.  No MRI was performed; give meclizine and valium.  She reports meclizine does not help.  woken from sleep feeling dizzy and "funny in the chest" - and the heart rate would not register on her fitbit  EKG personally reviewed by myself on todays visit Shows NSR rate 82 bpm    PMH:   has a past medical history of Heart murmur, Hypertension, and Melanoma ([place]) (1998).  PSH:    Past Surgical History:  Procedure Laterality Date  . CARDIAC  CATHETERIZATION     Pt doesn't recall date/no stents    Current Outpatient Medications  Medication Sig Dispense Refill  . amLODipine (NORVASC) 2.5 MG tablet Take 2.5 mg by mouth. Take 1 tablet QAM    . amLODipine (NORVASC) 5 MG tablet Take 5 mg by mouth at bedtime.    . Cholecalciferol (VITAMIN D) 50 MCG (2000 UT) tablet Take 2,000 Units by mouth daily.    Marland Kitchen KRILL OIL PO Take by mouth daily.     No current facility-administered medications for this visit.      Allergies:   Penicillins   Social History:  The patient  reports that she has never smoked. She has never used smokeless tobacco. She reports current alcohol use of about 1.0 standard drinks of alcohol per week. She reports that she does not use drugs.   Family History:   family history includes Heart Problems in her mother.    Review of Systems: Review of Systems  Constitutional: Negative.   Respiratory: Negative.   Cardiovascular: Negative.   Gastrointestinal: Negative.   Musculoskeletal: Negative.   Neurological: Positive for dizziness.  Psychiatric/Behavioral: Negative.   All other systems reviewed and are negative.    PHYSICAL EXAM: VS:  BP (!) 153/79 (BP Location: Left Arm, Patient Position: Sitting, Cuff Size: Normal)   Pulse 82   Ht 5' 2"  (1.575 m)   Wt 139 lb 4 oz (63.2 kg)   BMI 25.47 kg/m  , BMI Body mass index is 25.47 kg/m. GEN: Well nourished, well developed, in no acute  distress  HEENT: normal  Neck: no JVD, carotid bruits, or masses Cardiac: RRR; no murmurs, rubs, or gallops,no edema  Respiratory:  clear to auscultation bilaterally, normal work of breathing GI: soft, nontender, nondistended, + BS MS: no deformity or atrophy  Skin: warm and dry, no rash Neuro:  Strength and sensation are intact Psych: euthymic mood, full affect    Recent Labs: 04/13/2019: ALT 16; BUN 13; Creat 0.87; Hemoglobin 14.1; Platelets 298; Potassium 4.2; Sodium 137    Lipid Panel Lab Results  Component Value  Date   CHOL 253 (H) 04/13/2019   HDL 107 04/13/2019   LDLCALC 128 (H) 04/13/2019   TRIG 79 04/13/2019      Wt Readings from Last 3 Encounters:  05/08/19 139 lb 4 oz (63.2 kg)  04/13/19 137 lb 12.8 oz (62.5 kg)  04/08/19 142 lb (64.4 kg)       ASSESSMENT AND PLAN:    ICD-10-CM   1. Aortic atherosclerosis (HCC)  I70.0 EKG 12-Lead  2. Dizziness  R42 EKG 12-Lead  3. Essential (primary) hypertension  I10   4. Pure hypercholesterolemia  E78.00   5. Prediabetes  R73.03    Dizziness appears to be noncardiac Triggered by bending her head forward such as when she is looking at her phone, tablet, computer, mopping Previous CT head negative No concern for arrhythmia, she has measured her pulse 3 times a Melinda Le and during episodes with no irregularity No concern of orthostasis Prior cardiac catheterization echocardiogram within the past several years have been normal No murmur on exam -Further diagnostics could include carotid ultrasound which she reports will be done in [place].  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 EKG 12-Lead     Signed, Esmond Plants, M.D., Ph.D. 05/08/2019  [place], [place]

## 2019-05-07 ENCOUNTER — Telehealth: Payer: Self-pay | Admitting: Cardiovascular Disease

## 2019-05-07 NOTE — Telephone Encounter (Signed)

## 2019-05-08 ENCOUNTER — Encounter: Payer: Self-pay | Admitting: Cardiovascular Disease

## 2019-05-08 ENCOUNTER — Other Ambulatory Visit: Payer: Self-pay

## 2019-05-08 ENCOUNTER — Ambulatory Visit (INDEPENDENT_AMBULATORY_CARE_PROVIDER_SITE_OTHER): Payer: Medicare Other | Admitting: Cardiovascular Disease

## 2019-05-08 VITALS — BP 153/79 | HR 82 | Ht 62.0 in | Wt 139.2 lb

## 2019-05-08 DIAGNOSIS — I700 Atherosclerosis of aorta: Secondary | ICD-10-CM

## 2019-05-08 DIAGNOSIS — R7303 Prediabetes: Secondary | ICD-10-CM

## 2019-05-08 DIAGNOSIS — R42 Dizziness and giddiness: Secondary | ICD-10-CM | POA: Diagnosis not present

## 2019-05-08 DIAGNOSIS — I10 Essential (primary) hypertension: Secondary | ICD-10-CM

## 2019-05-08 DIAGNOSIS — E7800 Pure hypercholesterolemia, unspecified: Secondary | ICD-10-CM

## 2019-05-08 NOTE — Patient Instructions (Signed)
Medication Instructions:  No changes  If you need a refill on your cardiac medications before your next appointment, please call your pharmacy.    Lab work: No new labs needed   If you have labs (blood work) drawn today and your tests are completely normal, you will receive your results only by: Marland Kitchen MyChart Message (if you have MyChart) OR . A paper copy in the mail If you have any lab test that is abnormal or we need to change your treatment, we will call you to review the results.   Testing/Procedures: No new testing needed   Follow-Up: At [facility], you and your health needs are our priority.  As part of our continuing mission to provide you with exceptional heart care, we have created designated Provider Care Teams.  These Care Teams include your primary Cardiologist (physician) and Advanced Practice Providers (APPs -  Physician Assistants and Nurse Practitioners) who all work together to provide you with the care you need, when you need it.  .  Follow up appointment as needed  . Providers on your designated Care Team:   . Murray Hodgkins, NP . Christell Faith, PA-C . Marrianne Mood, PA-C  Any Other Special Instructions Will Be Listed Below (If Applicable).  For educational health videos Log in to : [email or web address] Or : [email or web address], password : [email or web address]

## 2019-05-10 ENCOUNTER — Encounter: Payer: Self-pay | Admitting: Family Medicine

## 2019-05-10 ENCOUNTER — Other Ambulatory Visit (INDEPENDENT_AMBULATORY_CARE_PROVIDER_SITE_OTHER): Payer: Self-pay | Admitting: Otolaryngology

## 2019-05-10 DIAGNOSIS — R42 Dizziness and giddiness: Secondary | ICD-10-CM

## 2019-05-10 DIAGNOSIS — H814 Vertigo of central origin: Secondary | ICD-10-CM

## 2019-05-14 ENCOUNTER — Ambulatory Visit (INDEPENDENT_AMBULATORY_CARE_PROVIDER_SITE_OTHER): Payer: Medicare Other

## 2019-05-14 ENCOUNTER — Other Ambulatory Visit: Payer: Self-pay

## 2019-05-14 ENCOUNTER — Ambulatory Visit: Payer: Medicare Other | Admitting: Family Medicine

## 2019-05-14 DIAGNOSIS — H814 Vertigo of central origin: Secondary | ICD-10-CM | POA: Diagnosis not present

## 2019-05-14 DIAGNOSIS — R42 Dizziness and giddiness: Secondary | ICD-10-CM

## 2019-05-18 ENCOUNTER — Telehealth: Payer: Self-pay

## 2019-05-18 NOTE — Telephone Encounter (Signed)
Not on med list but pharmacy faxed over refill request on ondansetron 63m 1 tab every 8hr prn

## 2019-05-20 NOTE — Telephone Encounter (Signed)
Please call patient to clarify - does she have chronic nausea that is intermittent and keeps some on hand? Or is she currently ill?  If currently ill, she needs an appointment; if chronic issue, may provide #[phone] with 0 refills.

## 2019-05-21 ENCOUNTER — Other Ambulatory Visit: Payer: Self-pay

## 2019-05-21 DIAGNOSIS — Z1239 Encounter for other screening for malignant neoplasm of breast: Secondary | ICD-10-CM

## 2019-05-21 DIAGNOSIS — Z1231 Encounter for screening mammogram for malignant neoplasm of breast: Secondary | ICD-10-CM

## 2019-05-21 MED ORDER — ONDANSETRON HCL 4 MG PO TABS: 4.0000 mg | ORAL_TABLET | Freq: Three times a day (TID) | ORAL | 0 refills | Status: DC | PRN

## 2019-05-21 NOTE — Telephone Encounter (Signed)
Pt states she gets some dizziness sometimes, has f/u with ENT and cardio and when she is dizzy she gets nausea and likes to take.  Also has an appt sch with neuro.

## 2019-06-06 ENCOUNTER — Other Ambulatory Visit: Payer: Self-pay | Admitting: Neurology

## 2019-06-06 DIAGNOSIS — I672 Cerebral atherosclerosis: Secondary | ICD-10-CM

## 2019-06-06 DIAGNOSIS — G43109 Migraine with aura, not intractable, without status migrainosus: Secondary | ICD-10-CM

## 2019-06-18 ENCOUNTER — Other Ambulatory Visit: Payer: Self-pay

## 2019-06-18 ENCOUNTER — Ambulatory Visit: Payer: Medicare Other | Attending: Neurology

## 2019-06-18 DIAGNOSIS — M6281 Muscle weakness (generalized): Secondary | ICD-10-CM | POA: Insufficient documentation

## 2019-06-18 DIAGNOSIS — R42 Dizziness and giddiness: Secondary | ICD-10-CM | POA: Insufficient documentation

## 2019-06-18 NOTE — Therapy (Signed)
[place] MAIN [facility] SERVICES [address] [place], [place], [address] Phone: [phone]   Fax:  [phone]  Physical Therapy Evaluation  Patient Details  Name: Melinda Le MRN: [id number] Date of Birth: 1941-06-06 Referring Provider (PT): Dr. Manuella Ghazi   Encounter Date: 06/18/2019  PT End of Session - 06/18/19 1301    Visit Number  1    Number of Visits  13    Date for PT Re-Evaluation  09/10/19    PT Start Time  1015    PT Stop Time  1115    PT Time Calculation (min)  60 min    Equipment Utilized During Treatment  Gait belt    Activity Tolerance  Patient tolerated treatment well    Behavior During Therapy  [facility] for tasks assessed/performed       Past Medical History:  Diagnosis Date  . Heart murmur   . Hypertension   . Melanoma ([place]) 1998   Resected at [facility]    Past Surgical History:  Procedure Laterality Date  . CARDIAC CATHETERIZATION     Pt doesn't recall date/no stents    There were no vitals filed for this visit.   Subjective Assessment - 06/18/19 1157    Subjective  Pt presents today for dizziness evaluation    Pertinent History  Pt states that she has been experiencing dizziness since May 1st, 2020.  She states that she was doing finanaces all day, and felt that this triggering her symptoms.  She has attempted to help with the finances monthly since that time, but states that it increases her symptoms and she has been unable to complete them without her husbands help since May. She reports a burning sensation in the occiput area that refers posteriorly in her head, and sometimes brings on nausea. She denies a prior diagnosis of migraines, but states that in the late 1990's she used to get a headache daily in the PM, however attributes them to stress.  Denies migranous headache symptoms in past or now.  ENT did not think it was inner ear related, however pt states that she doesn't recall any VNG or oculomotor testing.  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  She has been referred to Dr. Sharlet Salina for neck pain from the ENT, but states that she has not had an office visit yet. Pt reports that she has a past history of labrynthitis that was diagnosed by her PCP after experiencing prolonged dizziness following a flight.    Limitations  House hold activities;Reading    Patient Stated Goals  decrease dizziness and burning sensation in the back of her head    Currently in Pain?  No/denies         [facility] PT Assessment - 06/18/19 1024      Assessment   Medical Diagnosis  dizziness    Referring Provider (PT)  Dr. Manuella Ghazi    Onset Date/Surgical Date  03/09/19    Hand Dominance  Right    Next MD Visit  none scheduled    Prior Therapy  yes: 2004-neck      Precautions   Precautions  None      Restrictions   Weight Bearing Restrictions  No      Balance Screen   Has the patient fallen in the past 6 months  No    Has the patient had a decrease in activity level because of a fear  of falling?   Yes    Is the patient reluctant to leave their home because of a fear of falling?   No      Home Film/video editor residence      Prior Function   Level of Independence  Independent    Vocation  Retired    Leisure  movies, reading (but has trouble with vision)      Cognition   Overall Cognitive Status  Within Functional Limits for tasks assessed      Observation/Other Assessments   Other Surveys   Activities of Balance and Confidence Scale;Dizziness Handicap Inventory ([place])    Activities of Balance Confidence Scale (ABC Scale)   67.81%    Dizziness Handicap Inventory (DHI)   30/100      Standardized Balance Assessment   Standardized Balance Assessment  Dynamic Gait Index      Dynamic Gait Index   Level Surface  Normal    Change in Gait Speed  Normal    Gait with Horizontal Head Turns  Moderate Impairment    Gait with Vertical Head Turns  Mild  Impairment    Gait and Pivot Turn  Normal    Step Over Obstacle  Normal    Step Around Obstacles  Normal    Steps  Normal    Total Score  21            VESTIBULAR AND BALANCE EVALUATION   HISTORY:  Subjective history of current problem: Pt states that she has been experiencing dizziness since May 1st, 2020.  She states that she was doing finanaces all day, and felt that this triggering her symptoms.  She has attempted to help with the finances monthly since that time, but states that it increases her symptoms and she has been unable to complete them without her husbands help since May. She reports a burning sensation in the occiput area that refers posteriorly in her head, and sometimes brings on nausea. She denies a prior diagnosis of migraines, but states that in the late 1990's she used to get a headache daily in the PM, however attributes them to stress.  Denies migranous headache symptoms in past or now.  ENT did not think it was inner ear related, however pt states that she doesn't recall any VNG or oculomotor testing.  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  She has been referred to Dr. Sharlet Salina for neck pain from the ENT, but states that she has not had an office visit yet. Pt reports that she has a past history of labrynthitis that was diagnosed by her PCP after experiencing prolonged dizziness following a flight.   Description of dizziness: lightheadedness and a burning sensation in the occipitial area, but denies a sense of spinning Frequency: daily, but not all day.  States they can last 3-4 hrs to 12 hours typically; usually worse in AM, PM feels better; states that the lightheaded feeling passes fairly quickly.   Duration: dizziness/lightheadedness goes away fairly quickly after positional changes, but describes a residual unsteadiness/lightheadedness that is prolonged. Symptom nature: positional and motion provoked with head turns;  states that it feels worse when looking down  Provocative Factors: looking down mainly; looking up; turning head vertical and horizontal; prolonged sitting   Easing Factors: massaging her neck; get up and walk for >57mn or longer; go up and down stairs  Progression of symptoms: (better, worse, no  change since onset): reports some relief since onset with massaging her neck, but overall, not much of a difference since onset. History of similar episodes: denies similar episodes; however states that she was diagnosed with labrinthitis after a cross country flight in the early 2000's, and states that she has had headaches and neck pain in the past that she has done PT for.  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  Vision (diplopia, visual field loss, recent changes, last eye exam): Last vision exam was in early June, stating that she got a new rx, but doesn't feel a change in her dizziness or headaches; states that she feels her vision is still blurry; She does report that she felt relief from her dizziness and lightheadedness for 2 days after her appt.  She describes a sense of convergence insufficiency.   Red Flags: (dysarthria, dysphagia, drop attacks, bowel and bladder changes, recent weight loss/gain) Review of systems negative for red flags.     EXAMINATION  POSTURE: forward head  NEUROLOGICAL SCREEN: (2+ unless otherwise noted.) N=normal  Ab=abnormal  Level Dermatome R L Myotome R L Reflex R L  C3 Anterior Neck N N Sidebend C2-3 N N Jaw CN V    C4 Top of Shoulder N N Shoulder Shrug C4 N N Hoffman's UMN    C5 Lateral Upper Arm N N Shoulder ABD C4-5 N N Biceps C5-6    C6 Lateral Arm/ Thumb N N Arm Flex/ Wrist Ext C5-6 N N Brachiorad. C5-6    C7 Middle  Finger N N Arm Ext//Wrist Flex C6-7 N N Triceps C7    C8 4th & 5th Finger N N Flex/ Ext Carpi Ulnaris C8 N N Patellar (L3-4)    T1 Medial Arm N N Interossei T1 N N Gastrocnemius    L2 Medial thigh/groin N N Illiopsoas (L2-3) N N     L3 Lower thigh/med.knee N N Quadriceps (L3-4) N N     L4 Medial leg/lat thigh N N Tibialis Ant (L4-5) N N     L5 Lat. leg & dorsal foot N N EHL (L5) N N     S1 post/lat foot/thigh/leg N N Gastrocnemius (S1-2) N N     S2 Post./med. thigh & leg N N Hamstrings (L4-S3) N N         SOMATOSENSORY:         Sensation           Intact      Diminished         Absent  Light touch Normal       COORDINATION: Finger to Nose: mild dysmetria on R  Heel to Shin: Normal Pronator Drift: Negative Rapid Alternating Movements: Normal Finger to Thumb Opposition: Normal  MUSCULOSKELETAL SCREEN: Cervical Spine ROM: Decreased L and R cervical rotation, moreso on the L; decreased L and R cervical sidebending bilaterally; mild deficits noted in cervical extension; cervical flexion WNL.   ROM: UE and LE ROM WFL  MMT: hip flexion 4/5 bilaterally, with the rest of LE grossly being 5/5.  UE's grossly 4/5 throughout  Functional Mobility: WNL  Gait: WFL; no deficits or AD's noted with gait on level surfaces; deviations and slowed gait speed with gait involving horizontal head turns, with mild deficits with vertical  head turns    POSTURAL CONTROL TESTS:   Clinical Test of Sensory Interaction for Balance    (CTSIB):  CONDITION TIME STRATEGY SWAY  Eyes open, firm surface 30 seconds ankle 1+  Eyes closed, firm surface 30 seconds ankle 2+  Eyes open, foam surface 30 seconds ankle 2+  Eyes closed, foam surface 30 seconds ankle 3+    OCULOMOTOR / VESTIBULAR TESTING:  Oculomotor Exam- Room Light  Findings Comments  Ocular Alignment normal   Ocular ROM normal   Spontaneous Nystagmus normal   Gaze-Holding Nystagmus normal Potentially slight mid-gaze nystagmus noted in R eye  with R gaze, but difficult to see d/t hooded eyelids  End-Gaze Nystagmus normal   Vergence (normal 2-3") normal   Smooth Pursuit abnormal Mildly saccadic  Cross-Cover Test not examined   Saccades normal   VOR Cancellation abnormal Symptomatic dizziness  Left Head Thrust normal Difficult to perform d/t pt having difficulty relaxing  Right Head Thrust normal Difficult to perform d/t pt having difficulty relaxing  Static Acuity not examined   Dynamic Acuity not examined Deferred to next session d/t time constraints     BPPV TESTS:  Symptoms Duration Intensity Nystagmus  L Dix-Hallpike None   None  R Dix-Hallpike None   None  L Head Roll None   None  R Head Roll None   None  L Sidelying Test      R Sidelying Test        FUNCTIONAL OUTCOME MEASURES   Results Comments  DGI 21/24   ABC Scale 67.81%   DHI 30/100      Objective measurements completed on examination: See above findings.     PT Short Term Goals - 06/18/19 1221      PT SHORT TERM GOAL #1   Title  Pt will be independent with HEP in order to improve strength and balance in order to decrease fall risk and improve function at home.    Time  6    Period  Weeks    Status  New    Target Date  07/30/19         PT Long Term Goals - 06/18/19 1222      PT LONG TERM GOAL #1   Title  Pt wil report a 75% reduction in duration and/or frequency of symptoms in order to improve her QOL    Time  12    Period  Weeks    Status  New    Target Date  09/10/19      PT LONG TERM GOAL #2   Title  Pt will improve DGI by at least 3 points in order to demonstrate clinically significant improvement in balance and decreased risk for falls.    Baseline  06/18/19: 21/24    Time  12    Period  Weeks    Status  New    Target Date  09/10/19      PT LONG TERM GOAL #3   Title  Pt will improve ABC by at least 13% in order to demonstrate clinically significant improvement in balance confidence.    Baseline  06/18/19:67.81%    Time  12     Period  Weeks    Status  New    Target Date  09/10/19      PT LONG TERM GOAL #4   Title  Pt will decrease DHI score by at least 18 points in order to demonstrate clinically significant reduction in disability    Baseline  06/18/19: 30/100    Time  12    Period  Weeks    Status  New    Target Date  09/10/19           Plan - 06/18/19 1213    Clinical Impression Statement  Pt is a pleasant 78 year-old female referred for dizziness. Pt is a poor historian, but reports a prolonged sense of lightheadeness and dizziness that is worse with head movements, especially when looking down, with residual feelings of lightheadedness that last anywhere from 3-12 hours.  She also reports a sense of burning in the occiput region that refers to her posterior head and neck.  PT examination reveals deficits in cervical ROM, balance with eyes closed on level surface, balance on foam with eyes open and eyes closed, and significant difficulty with gait involving horizontal head turns, with milder deficits noted with vertical head turns.  She endorses an increase in symptoms after oculomotor screening, with abnormal saccades and VOR cancellation, with potential mid gaze nystagmus with R directed gaze. Head Thrust testing appears normal, however is difficult to assess due to an inability to fully relax.  In general, oculomotor findings were difficult to assess due to hooded eyelids. She endorses a 67.81% on the ABC, and a 30/100 on the [facility].  She scored a 21/24 on the DGI today, displaying difficulty with horizontal and vertical head turns during gait.  Dynamic Visual Acuity and a more thorough cervical screening were deferred to next session due to time constraints.  Pt will benefit from skilled PT services to address deficits in balance, dizziness, dynamic gait, and occipital neuralgia in order to improve her QOL.    Personal Factors and Comorbidities  Age;Past/Current Experience;Behavior Pattern;Comorbidity 3+     Comorbidities  Hx of cancer, HTN, heart murmur    Examination-Activity Limitations  Bend;Reach Overhead    Examination-Participation Restrictions  Cleaning;Meal Prep;Personal Finances    Stability/Clinical Decision Making  Evolving/Moderate complexity    Clinical Decision Making  Moderate    Rehab Potential  Fair    PT Frequency  1x / week    PT Duration  12 weeks    PT Treatment/Interventions  ADLs/Self Care Home Management;Canalith Repostioning;Cryotherapy;Electrical Stimulation;Moist Heat;Traction;Ultrasound;Functional mobility training;Therapeutic activities;Therapeutic exercise;Balance training;Neuromuscular re-education;Patient/family education;Manual techniques;Passive range of motion;Dry needling;Vestibular;Visual/perceptual remediation/compensation;Iontophoresis 29m/ml Dexamethasone;Spinal Manipulations;Joint Manipulations    PT Next Visit Plan  dynamic visual acuity and a more indepth cervical screening    PT Home Exercise Plan  none given today    Consulted and Agree with Plan of Care  Patient       Patient will benefit from skilled therapeutic intervention in order to improve the following deficits and impairments:  Dizziness, Decreased range of motion, Impaired perceived functional ability, Decreased strength, Postural dysfunction, Pain  Visit Diagnosis: 1. Dizziness and giddiness   2. Muscle weakness (generalized)        Problem List Patient Active Problem List   Diagnosis Date Noted  . 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 Elevated rheumatoid factor 04/26/2018  . Crohn's ileitis, without complications ([place] 060/73/7106 . Combined forms of age-related cataract of left eye 01/24/2018  . Osteopenia of multiple sites 05/31/2017  . Prediabetes 08/07/2016  . Hiatal hernia with gastroesophageal reflux 03/29/2016  . Pure hypercholesterolemia 03/29/2016  . Essential (primary) hypertension 10/17/2013    This entire session was  performed under direct supervision and direction of a licensed therapist/therapist assistant . I have personally read, edited and approve of  the note as written.   Lutricia Horsfall, SPT [facility] PT, DPT, GCS  Huprich,Jason 06/18/2019, 3:59 PM  [place] MAIN [facility] SERVICES [address] [place], [place], [address] Phone: [phone]   Fax:  [phone]  Name: Melinda Le MRN: [id number] Date of Birth: 1940-12-04

## 2019-06-19 ENCOUNTER — Ambulatory Visit
Admission: RE | Admit: 2019-06-19 | Discharge: 2019-06-19 | Disposition: A | Discharge status: Home / Self Care | Payer: Medicare Other | Source: Ambulatory Visit | Attending: Neurology | Admitting: Neurology

## 2019-06-19 DIAGNOSIS — G43109 Migraine with aura, not intractable, without status migrainosus: Secondary | ICD-10-CM | POA: Insufficient documentation

## 2019-06-21 ENCOUNTER — Ambulatory Visit
Admission: RE | Admit: 2019-06-21 | Discharge: 2019-06-21 | Disposition: A | Discharge status: Home / Self Care | Payer: Medicare Other | Source: Ambulatory Visit | Attending: Family Medicine | Admitting: Family Medicine

## 2019-06-21 ENCOUNTER — Telehealth: Payer: Self-pay | Admitting: Family Medicine

## 2019-06-21 ENCOUNTER — Other Ambulatory Visit: Payer: Self-pay

## 2019-06-21 DIAGNOSIS — Z1231 Encounter for screening mammogram for malignant neoplasm of breast: Secondary | ICD-10-CM | POA: Insufficient documentation

## 2019-06-21 NOTE — Telephone Encounter (Signed)
Pt called in to request a refill for amLODipine (NORVASC) 5 MG tablet  Pharmacy:  [facility] [address] [place] - [place] [phone] (Phone) [phone] (Fax)

## 2019-06-22 ENCOUNTER — Other Ambulatory Visit: Payer: Self-pay | Admitting: Emergency Medicine

## 2019-06-22 MED ORDER — AMLODIPINE BESYLATE 5 MG PO TABS: 5.0000 mg | ORAL_TABLET | Freq: Every day | ORAL | 0 refills | Status: DC

## 2019-06-22 NOTE — Telephone Encounter (Signed)
Please schedule patient for follow up in the next 30 days.  

## 2019-06-22 NOTE — Telephone Encounter (Signed)
Order sent to [facility] for refill 

## 2019-06-25 ENCOUNTER — Other Ambulatory Visit: Payer: Self-pay | Admitting: Emergency Medicine

## 2019-06-25 MED ORDER — AMLODIPINE BESYLATE 5 MG PO TABS: 5.0000 mg | ORAL_TABLET | Freq: Every day | ORAL | 0 refills | Status: DC

## 2019-06-25 NOTE — Telephone Encounter (Signed)
lvm to sch appt

## 2019-06-25 NOTE — Telephone Encounter (Signed)
Needs follow up in the next 2 weeks for BP follow up.

## 2019-06-26 ENCOUNTER — Encounter: Payer: Self-pay | Admitting: Physical Therapy

## 2019-06-26 ENCOUNTER — Other Ambulatory Visit: Payer: Self-pay

## 2019-06-26 ENCOUNTER — Ambulatory Visit: Payer: Medicare Other | Admitting: Physical Therapy

## 2019-06-26 DIAGNOSIS — R42 Dizziness and giddiness: Secondary | ICD-10-CM | POA: Diagnosis not present

## 2019-06-26 DIAGNOSIS — M6281 Muscle weakness (generalized): Secondary | ICD-10-CM

## 2019-06-26 NOTE — Therapy (Signed)
[place] MAIN [facility] SERVICES [address] [place], [place], [address] Phone: [phone]   Fax:  [phone]  Physical Therapy Treatment  Patient Details  Name: Melinda Le MRN: [id number] Date of Birth: 08-01-1941 Referring Provider (PT): Dr. Manuella Ghazi   Encounter Date: 06/26/2019  PT End of Session - 06/26/19 1300    Visit Number  2    Number of Visits  13    Date for PT Re-Evaluation  09/10/19    PT Start Time  1300    PT Stop Time  1353    PT Time Calculation (min)  53 min    Equipment Utilized During Treatment  Gait belt    Activity Tolerance  Patient tolerated treatment well    Behavior During Therapy  [facility] for tasks assessed/performed       Past Medical History:  Diagnosis Date  . Heart murmur   . Hypertension   . Melanoma ([place]) 1998   Resected at [facility]    Past Surgical History:  Procedure Laterality Date  . CARDIAC CATHETERIZATION     Pt doesn't recall date/no stents    There were no vitals filed for this visit.  Subjective Assessment - 06/26/19 1300    Subjective  Patient states she is still dizzy. Patient states yesterday she was not as dizzy in the am but reports around 4:30pm she was dizzy all evening whenever she was standing. Patient reports that what bothers her the most is when she gets nausea/ "sick". Patient states she was nauseous twice this past week and had to take anti-nausea medicine. Patient reports the MRI revealed narrowing arteries in her brain. He talked with her about an occipital nerve block. Patient started taking a baby aspirin daily now as a result of her MRI results.Patient reports that eating brings on her dizziness. Patient states drinking a glass of wine helps calm her symptoms.    Pertinent History  Pt states that she has been experiencing dizziness since May 1st, 2020.  She states that she was doing finanaces all day, and felt that this triggering her symptoms.  She has attempted to help with the  finances monthly since that time, but states that it increases her symptoms and she has been unable to complete them without her husbands help since May. She reports a burning sensation in the occiput area that refers posteriorly in her head, and sometimes brings on nausea. She denies a prior diagnosis of migraines, but states that in the late 1990's she used to get a headache daily in the PM, however attributes them to stress.  Denies migranous headache symptoms in past or now.  ENT did not think it was inner ear related, however pt states that she doesn't recall any VNG or oculomotor testing.  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  She has been referred to Dr. Sharlet Salina for neck pain from the ENT, but states that she has not had an office visit yet. Pt reports that she has a past history of labrynthitis that was diagnosed by her PCP after experiencing prolonged dizziness following a flight.    Limitations  House hold activities;Reading    Patient Stated Goals  decrease dizziness and burning sensation in the back of her head        Neuromuscular Re-education:   Performed DVA test: DVA-static: line 8 Dynamic: line 4  4 line loss with DVA testing; in need of intervention  VOR X 1 exercise:  Demonstrated and educated as to VOR X1.  Patient performed VOR X 1 horizontal in sitting 1 rep of 30 seconds and 3 reps of 1 minute each with verbal cues for technique.  Patient reports the target is blurring and double vision at times and reports no change in her dizziness, but states she is getting mild sensation of headache coming on posterior base of head.   Patient reports she see double for one of the lines of the X fairly consistently when doing the VOR X 1.   Airex pad:  On firm surface and then on Airex pad, patient performed feet together and semi-tandem progressions with alternating lead leg with and without body turns and horizontal and vertical head  turns with CGA.  Patient challenged by semi-tandem on foam with head turns and especially with body turns.   Ambulation with head turns:  Patient performed 19' trials of forwards and retro ambulation with horizontal head turns with CGA.  Patient demonstrates multiple episodes of veering with forward ambulation with head turns. Patient reports that she "did not know how bad" her balance was but denies increase in her dizziness symptoms with this activity.   Walking while scanning for visual targets: Performed 170' trial of forwards and retro ambulation while scanning for visual targets in hallway with CGA. Reports no increase in her dizziness symptoms with this activity, but reports fatigue.   At end of session when pt stood up, she reported mild increase in her dizziness symptoms.  Patient demonstrates 4 line loss on the Dynamic Visual Analog (DVA) test. Patient challenged by uneven surfaces, narrow base of support, activities with body and head turns this date. Patient issued exercises for home exercise program this date. Patient noted to have multiple episodes of veering with ambulation with horizontal head turns but patient denies increase in her dizziness. Patient would benefit from continued PT services to further address goals and functional deficits.    PT Education - 06/26/19 1300    Education Details  discussed plan of care; issued semitandem and feet together with body turns and head turns horiz and vert standing on pillow for HEP    Person(s) Educated  Patient    Methods  Explanation;Handout;Demonstration;Verbal cues    Comprehension  Verbalized understanding;Returned demonstration       PT Short Term Goals - 06/18/19 1221      PT SHORT TERM GOAL #1   Title  Pt will be independent with HEP in order to improve strength and balance in order to decrease fall risk and improve function at home.    Time  6    Period  Weeks    Status  New    Target Date  07/30/19        PT  Long Term Goals - 06/18/19 1222      PT LONG TERM GOAL #1   Title  Pt wil report a 75% reduction in duration and/or frequency of symptoms in order to improve her QOL    Time  12    Period  Weeks    Status  New    Target Date  09/10/19      PT LONG TERM GOAL #2   Title  Pt will improve DGI by at least 3 points in order to demonstrate clinically significant improvement in balance and decreased risk for falls.    Baseline  06/18/19: 21/24    Time  12    Period  Weeks  Status  New    Target Date  09/10/19      PT LONG TERM GOAL #3   Title  Pt will improve ABC by at least 13% in order to demonstrate clinically significant improvement in balance confidence.    Baseline  06/18/19:67.81%    Time  12    Period  Weeks    Status  New    Target Date  09/10/19      PT LONG TERM GOAL #4   Title  Pt will decrease DHI score by at least 18 points in order to demonstrate clinically significant reduction in disability    Baseline  06/18/19: 30/100    Time  12    Period  Weeks    Status  New    Target Date  09/10/19            Plan - 06/26/19 1300    Clinical Impression Statement  Patient demonstrates 4 line loss on the Dynamic Visual Analog (DVA) test. Patient challenged by uneven surfaces, narrow base of support, activities with body and head turns this date. Patient issued exercises for home exercise program this date. Patient noted to have multiple episodes of veering with ambulation with horizontal head turns but patient denies increase in her dizziness. Patient would benefit from continued PT services to further address goals and functional deficits.    Personal Factors and Comorbidities  Age;Past/Current Experience;Behavior Pattern;Comorbidity 3+    Comorbidities  Hx of cancer, HTN, heart murmur    Examination-Activity Limitations  Bend;Reach Overhead    Examination-Participation Restrictions  Cleaning;Meal Prep;Personal Finances    Stability/Clinical Decision Making   Evolving/Moderate complexity    Rehab Potential  Fair    PT Frequency  1x / week    PT Duration  12 weeks    PT Treatment/Interventions  ADLs/Self Care Home Management;Canalith Repostioning;Cryotherapy;Electrical Stimulation;Moist Heat;Traction;Ultrasound;Functional mobility training;Therapeutic activities;Therapeutic exercise;Balance training;Neuromuscular re-education;Patient/family education;Manual techniques;Passive range of motion;Dry needling;Vestibular;Visual/perceptual remediation/compensation;Iontophoresis 19m/ml Dexamethasone;Spinal Manipulations;Joint Manipulations    PT Next Visit Plan  dynamic visual acuity and a more indepth cervical screening    PT Home Exercise Plan  none given today    Consulted and Agree with Plan of Care  Patient       Patient will benefit from skilled therapeutic intervention in order to improve the following deficits and impairments:  Dizziness, Decreased range of motion, Impaired perceived functional ability, Decreased strength, Postural dysfunction, Pain  Visit Diagnosis: 1. Dizziness and giddiness   2. Muscle weakness (generalized)        Problem List Patient Active Problem List   Diagnosis Date Noted  . 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 Elevated rheumatoid factor 04/26/2018  . Crohn's ileitis, without complications ([place] 059/93/5701 . Combined forms of age-related cataract of left eye 01/24/2018  . Osteopenia of multiple sites 05/31/2017  . Prediabetes 08/07/2016  . Hiatal hernia with gastroesophageal reflux 03/29/2016  . Pure hypercholesterolemia 03/29/2016  . Essential (primary) hypertension 10/17/2013    Sircharles Holzheimer 06/26/2019, 3:41 PM  CDoylineMAIN RNov[facility] CenterSERVICES [address]RAux[address] [place] [address]Phone: [phone]  Fax:  [phone] Name: SKalysta KneisleyMRN: [id number] of Birth: [birth year]

## 2019-06-27 NOTE — Telephone Encounter (Signed)
Please schedule appointment.

## 2019-07-04 ENCOUNTER — Ambulatory Visit (INDEPENDENT_AMBULATORY_CARE_PROVIDER_SITE_OTHER): Payer: Medicare Other

## 2019-07-04 ENCOUNTER — Ambulatory Visit (INDEPENDENT_AMBULATORY_CARE_PROVIDER_SITE_OTHER): Payer: Medicare Other | Admitting: Nurse Practitioner

## 2019-07-04 ENCOUNTER — Other Ambulatory Visit: Payer: Self-pay

## 2019-07-04 ENCOUNTER — Ambulatory Visit: Payer: Medicare Other

## 2019-07-04 ENCOUNTER — Encounter: Payer: Self-pay | Admitting: Nurse Practitioner

## 2019-07-04 VITALS — BP 175/64 | HR 79 | Temp 98.7°F

## 2019-07-04 VITALS — BP 168/70 | HR 85 | Ht 61.0 in | Wt 140.0 lb

## 2019-07-04 DIAGNOSIS — R42 Dizziness and giddiness: Secondary | ICD-10-CM

## 2019-07-04 DIAGNOSIS — Z8673 Personal history of transient ischemic attack (TIA), and cerebral infarction without residual deficits: Secondary | ICD-10-CM

## 2019-07-04 DIAGNOSIS — I10 Essential (primary) hypertension: Secondary | ICD-10-CM | POA: Diagnosis not present

## 2019-07-04 DIAGNOSIS — R002 Palpitations: Secondary | ICD-10-CM

## 2019-07-04 DIAGNOSIS — E782 Mixed hyperlipidemia: Secondary | ICD-10-CM

## 2019-07-04 DIAGNOSIS — M6281 Muscle weakness (generalized): Secondary | ICD-10-CM

## 2019-07-04 NOTE — Therapy (Signed)
[place] MAIN [facility] SERVICES [address] [place], [place], [address] Phone: [phone]   Fax:  [phone]  Physical Therapy Treatment  Patient Details  Name: Melinda Le MRN: [id number] Date of Birth: [birth year] Referring Provider (PT): Dr. Manuella Ghazi   Encounter Date: 07/04/2019  PT End of Session - 07/04/19 0937    Visit Number  3    Number of Visits  13    Date for PT Re-Evaluation  09/10/19    PT Start Time  0940    PT Stop Time  1025    PT Time Calculation (min)  45 min    Equipment Utilized During Treatment  Gait belt    Activity Tolerance  Patient tolerated treatment well    Behavior During Therapy  [facility] for tasks assessed/performed       Past Medical History:  Diagnosis Date  . Heart murmur   . Hypertension   . Melanoma ([place]) 1998   Resected at [facility]    Past Surgical History:  Procedure Laterality Date  . CARDIAC CATHETERIZATION     Pt doesn't recall date/no stents    Vitals:   07/04/19 0943  BP: (!) 175/64  Pulse: 79  Temp: 98.7 F (37.1 C)  SpO2: 99%    Subjective Assessment - 07/04/19 0935    Subjective  Pt reports having a bad night. She experienced sweating, shaking and a heaviness in her chest. She has an appt with cardiology this afternoon. Pt states she has had a range of good and bad days regarding dizziness. She reports experiencing less episodes of pressure in the back of her head leading to nausea. Overall, she reports doing better than when first beginning physical therapy. For the most part she has been consistent with her HEP.    Pertinent History  Pt states that she has been experiencing dizziness since May 1st, 2020.  She states that she was doing finanaces all day, and felt that this triggering her symptoms.  She has attempted to help with the finances monthly since that time, but states that it increases her symptoms and she has been unable to complete them without her husbands help since May. She  reports a burning sensation in the occiput area that refers posteriorly in her head, and sometimes brings on nausea. She denies a prior diagnosis of migraines, but states that in the late 1990's she used to get a headache daily in the PM, however attributes them to stress.  Denies migranous headache symptoms in past or now.  ENT did not think it was inner ear related, however pt states that she doesn't recall any VNG or oculomotor testing.  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  She has been referred to Dr. Sharlet Salina for neck pain from the ENT, but states that she has not had an office visit yet. Pt reports that she has a past history of labrynthitis that was diagnosed by her PCP after experiencing prolonged dizziness following a flight.    Limitations  House hold activities;Reading    Patient Stated Goals  decrease dizziness and burning sensation in the back of her head    Currently in Pain?  No/denies        TREATMENT   Neuromuscular Reeducation  Seated VORx1 horizontal 2 x 60s; mod verbal and tactile cueing for technique  Standing VORx1 horizontal 2 x 60s;   Airex feet together EC 2x30s  Airex feet  together EO with horizontal and vertical head turns2 x30s each  Airex semi-tandem stance x30s each LE  Airex semi-tandem stance with horizontal and vertical head turns x30s each direction alt LE  Airex balance with dynamic reaching for cones over shoulder and pass back to therapist contralateral shoulder x12 each direction  Gait in hallway forward with ball pass to therapist at varying heights 2x75' each  Gait in hallway forward with horizontal ball pass to therapist x75' each; pt reports 3/10 dizziness and demonstrates instability with multiple steps laterally to gain balance    Pt educated throughout session about proper posture and technique with exercises. Improved exercise technique, movement at target joints, use of target muscles after min to  mod verbal, visual, tactile cues.    Pt demonstrated good motivation during today's session. Pt had reported inc in dizziness 3/10 with horizontal ball pass during forward gait with therapist. She demonstrated unsteadiness after gait taking multiple steps laterally to gain balance requiring CGA-minA for safety and steadying. Pt demonstrated difficulty in semi tandem stance balance and narrow stance especially when visual system compromised by instability demonstrating significant ankle and hip strategy. Pt would benefit from PT services to address deficits in strength, balance, and mobility in order to return to full function at home.         PT Education - 07/04/19 0937    Education Details  Exercise technique    Person(s) Educated  Patient    Methods  Explanation;Demonstration;Tactile cues;Verbal cues    Comprehension  Verbal cues required;Returned demonstration;Verbalized understanding;Tactile cues required       PT Short Term Goals - 06/18/19 1221      PT SHORT TERM GOAL #1   Title  Pt will be independent with HEP in order to improve strength and balance in order to decrease fall risk and improve function at home.    Time  6    Period  Weeks    Status  New    Target Date  07/30/19        PT Long Term Goals - 06/18/19 1222      PT LONG TERM GOAL #1   Title  Pt wil report a 75% reduction in duration and/or frequency of symptoms in order to improve her QOL    Time  12    Period  Weeks    Status  New    Target Date  09/10/19      PT LONG TERM GOAL #2   Title  Pt will improve DGI by at least 3 points in order to demonstrate clinically significant improvement in balance and decreased risk for falls.    Baseline  06/18/19: 21/24    Time  12    Period  Weeks    Status  New    Target Date  09/10/19      PT LONG TERM GOAL #3   Title  Pt will improve ABC by at least 13% in order to demonstrate clinically significant improvement in balance confidence.    Baseline   06/18/19:67.81%    Time  12    Period  Weeks    Status  New    Target Date  09/10/19      PT LONG TERM GOAL #4   Title  Pt will decrease DHI score by at least 18 points in order to demonstrate clinically significant reduction in disability    Baseline  06/18/19: 30/100    Time  12    Period  Weeks  Status  New    Target Date  09/10/19            Plan - 07/04/19 6979    Clinical Impression Statement  Pt demonstrated good motivation during today's session. Pt had reported inc in dizziness 3/10 with horizontal ball pass during forward gait with therapist. She demonstrated unsteadiness after gait taking multiple steps laterally to gain balance requiring CGA-minA for safety and steadying. Pt demonstrated difficulty in semi tandem stance balance and narrow stance especially when visual system compromised by instability demonstrating significant ankle and hip strategy. Pt would benefit from PT services to address deficits in strength, balance, and mobility in order to return to full function at home.    Personal Factors and Comorbidities  Age;Past/Current Experience;Behavior Pattern;Comorbidity 3+    Comorbidities  Hx of cancer, HTN, heart murmur    Examination-Activity Limitations  Bend;Reach Overhead    Examination-Participation Restrictions  Cleaning;Meal Prep;Personal Finances    Stability/Clinical Decision Making  Evolving/Moderate complexity    Rehab Potential  Fair    PT Frequency  1x / week    PT Duration  12 weeks    PT Treatment/Interventions  ADLs/Self Care Home Management;Canalith Repostioning;Cryotherapy;Electrical Stimulation;Moist Heat;Traction;Ultrasound;Functional mobility training;Therapeutic activities;Therapeutic exercise;Balance training;Neuromuscular re-education;Patient/family education;Manual techniques;Passive range of motion;Dry needling;Vestibular;Visual/perceptual remediation/compensation;Iontophoresis 42m/ml Dexamethasone;Spinal Manipulations;Joint Manipulations     PT Next Visit Plan  dynamic visual acuity and a more indepth cervical screening    PT Home Exercise Plan  none given today    Consulted and Agree with Plan of Care  Patient       Patient will benefit from skilled therapeutic intervention in order to improve the following deficits and impairments:  Dizziness, Decreased range of motion, Impaired perceived functional ability, Decreased strength, Postural dysfunction, Pain  Visit Diagnosis: Dizziness and giddiness  Muscle weakness (generalized)     Problem List Patient Active Problem List   Diagnosis Date Noted  . 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 Elevated rheumatoid factor 04/26/2018  . Crohn's ileitis, without complications ([place] 048/11/6551 . Combined forms of age-related cataract of left eye 01/24/2018  . Osteopenia of multiple sites 05/31/2017  . Prediabetes 08/07/2016  . Hiatal hernia with gastroesophageal reflux 03/29/2016  . Pure hypercholesterolemia 03/29/2016  . Essential (primary) hypertension 10/17/2013   This entire session was performed under direct supervision and direction of a licensed therapist/therapist assistant . I have personally read, edited and approve of the note as written.   NJuliann PulseSPT JPhillips GroutPT, DPT, GCS  Huprich,Jason 07/04/2019, 12:03 PM  CTri-LakesMAIN RIntegris Southwest Medical CenterSERVICES [address] [address]Phone: [phone]  Fax:  [phone] Name: Melinda HeilerMRN: [id number] of Birth: 4June 04, 1942

## 2019-07-04 NOTE — Progress Notes (Signed)
Office Visit    Patient Name: Melinda Le Date of Encounter: 07/04/2019  Primary Care Provider:  Hubbard Hartshorn, FNP Primary Cardiologist:  Ida Rogue, MD  Chief Complaint    78 year old female with a history of atypical chest pain and normal coronary arteries by catheterization in 2016, hypertension, and hyperlipidemia, who has recently been evaluated for dizziness and found to have a remote stroke and an MRI and presents for follow-up at the recommendation of her neurologist.  Past Medical History    Past Medical History:  Diagnosis Date  . Atypical chest pain    a. 2009 reportedly nl cath; b. 02/2015 Nl MV; c. 07/2015 Cath: nl cors [facility]).  . Dizziness    a. 05/2019 Carotid U/S: Nl RICA. LICA 1-51%. Antegrade vertebral flow; b. 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 Apparent high-grade focal stenosis of petrous RICA and paraclinoid LICA. May be exagerrated by artifact.  Marland Kitchen Heart murmur   . History of echocardiogram    a. 10/2016 Echo: EF 65%, no rwma, Gr1 DD. Triv to Mild MR, Triv TR.  Marland Kitchen Hypertension   . Melanoma ([place]) 1998   Resected at [facility]  . Stroke [facility])    a. 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   Past Surgical History:  Procedure Laterality Date  . CARDIAC CATHETERIZATION     Pt doesn't recall date/no stents    Allergies  Allergies  Allergen Reactions  . Penicillins Hives    History of Present Illness    78 year old female with the above past medical history including atypical chest pain with normal coronary arteries by catheterization September 2016, hypertension, and hyperlipidemia.  She was previously evaluated by Dr. Rockey Situ in June of this year secondary to prolonged episodes of dizziness and lightheadedness sometimes associated with headache and nausea, typically exacerbated by flexion of her neck.  She was previous evaluated in the emergency department in late May for similar symptoms without acute  findings.  She noted occasional palpitations but normal heart rates on her Fitbit.  It was not felt that she required further cardiac evaluation at that time.  She has since followed up with neurology and underwent carotid ultrasound in July which was relatively normal.  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  Given finding of old stroke on MRI with reported prior history of palpitations, she was advised to follow-up with cardiology for event monitoring.  With regards to finding on MRA, she was offered vascular surgical referral but wanted to think about it, especially in the setting of normal carotid ultrasound 1 month prior.  Since her last visit here, she denies chest pain, dyspnea, palpitations, PND, orthopnea, syncope, edema, or early satiety.  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  Home Medications    Prior to Admission medications   Medication Sig Start Date End Date Taking? Authorizing Provider  amLODipine (NORVASC) 2.5 MG tablet Take 2.5 mg by mouth.   Yes [provider]  amLODipine (NORVASC) 5 MG tablet Take 1 tablet (5 mg total) by mouth at bedtime. 06/25/19  Yes Hubbard Hartshorn, FNP  BABY ASPIRIN PO Take by mouth.   Yes [provider]  Cholecalciferol (VITAMIN D) 50 MCG (2000 UT) tablet Take  2,000 Units by mouth daily.   Yes [provider]  fexofenadine (ALLEGRA) 180 MG tablet Take 180 mg by mouth daily.   Yes [provider]  KRILL OIL PO Take by mouth daily.   Yes [provider]  ondansetron (ZOFRAN) 4 MG tablet Take 1 tablet (4 mg total) by mouth every 8 (eight) hours as needed for nausea or vomiting. 05/21/19  Yes Sowles, Drue Stager, MD    Review of Systems     Ongoing intermittent dizziness and unsteadiness which has not improved with vestibular therapy.  She denies chest pain, dyspnea, palpitations, PND, orthopnea, dizziness, syncope, edema, or early satiety.  She follows her blood pressures at home regularly and note that they typically run in the 140s and above in the mornings and frequently run in the 130s in the afternoon.  All other systems reviewed and are otherwise negative except as noted above.  Physical Exam    VS:  BP (!) 168/70 (BP Location: Left Arm, Patient Position: Sitting, Cuff Size: Normal)   Pulse 85   Ht 5' 1"  (1.549 m)   Wt 140 lb (63.5 kg)   SpO2 98%   BMI 26.45 kg/m  , BMI Body mass index is 26.45 kg/m. GEN: Well nourished, well developed, in no acute distress. HEENT: normal. Neck: Supple, no JVD, carotid bruits, or masses. Cardiac: RRR, no murmurs, rubs, or gallops. No clubbing, cyanosis, edema.  Radials/PT 2+ and equal bilaterally.  Respiratory:  Respirations regular and unlabored, clear to auscultation bilaterally. GI: Soft, nontender, nondistended, BS + x 4. MS: no deformity or atrophy. Skin: warm and dry, no rash. Neuro:  Strength and sensation are intact. Psych: Normal affect.  Accessory Clinical Findings    ECG personally reviewed by me today -regular sinus rhythm, 80, left atrial enlargement- no acute changes.  Lab Results  Component Value Date   WBC 6.9 04/13/2019   HGB 14.1 04/13/2019   HCT 42.0 04/13/2019   MCV 94.8 04/13/2019   PLT 298 04/13/2019   Lab Results  Component Value Date   CREATININE 0.87 04/13/2019   BUN 13 04/13/2019   NA 137 04/13/2019   K 4.2 04/13/2019   CL 102 04/13/2019   CO2 23 04/13/2019   Lab Results  Component Value Date   ALT 16 04/13/2019   AST 16 04/13/2019   BILITOT 0.4 04/13/2019   Lab Results  Component Value Date   CHOL 253 (H) 04/13/2019   HDL 107 04/13/2019   LDLCALC 128 (H) 04/13/2019   TRIG 79 04/13/2019   CHOLHDL 2.4 04/13/2019     Assessment &  Plan    1.  Dizziness/left parietal lobe cortical infarct: She has been evaluated by cardiology, ENT and most recently neurology.  She was diagnosed with vestibular migraines and is now undergoing vestibular therapy through physical therapy.  She has been tolerating this well and has noted an improvement in her balance and reduction in dizzy spells.  Recent MRI showed a small left parietal lobe cortical infarct which in retrospect was also present on a CT in May.  In the setting of this finding, she was referred back to cardiology for consideration of event monitoring.  She denies any palpitations.  I will arrange for a 2-week ZIO monitor to assess for atrial fibrillation.  2.  Essential hypertension: Blood pressure is elevated today.  She has a record of her blood pressures at home and it is notable that she typically runs in the 140s and above in the mornings  and frequently in the 130s and above in the afternoons.  She is currently taking amlodipine 5 mg in the mornings and 2.5 mg in the afternoon.  She feels that this is not enough and I agree.  I would recommend titration to 10 mg daily.  As she hopes to not have to follow-up with cardiology in the long run, she will discuss this with her primary care provider at tomorrow's scheduled visit so that her amlodipine can continue to be refilled through her primary care provider's office.  3.  Abnormal MRI/MRA: As above, recent MRI showed a small left parietal lobe cortical infarct and MRA suggested bilateral carotid arterial disease, though artifact could not be ruled out.  Interestingly, carotid ultrasound in July showed a normal right internal carotid artery and 1 to 39% stenosis of the left internal carotid artery.  She is considering referral to vascular surgery given discordance of the 2 studies.  She remains on aspirin therapy.  We discussed that given some carotid plaque, regardless of severity, that she would benefit from statin therapy in the setting  of a total cholesterol of 253 and LDL of 128.  Like her blood pressure medication, she would prefer that this be ordered and refilled through her primary care provider's office.  4.  Hyperlipidemia: See #3.  Would have a low threshold to initiate statin therapy given abnormal carotid ultrasound and MRA with small stroke noted on MRI.  5.  History of atypical chest pain: No recurrence.  Normal coronaries by cath in 2016.  6.  Disposition: We will follow-up the 14-day monitor.  Otherwise, follow-up with cardiology as needed.  Murray Hodgkins, NP 07/04/2019, 5:12 PM

## 2019-07-04 NOTE — Patient Instructions (Signed)
Medication Instructions:  Your physician recommends that you continue on your current medications as directed. Please refer to the Current Medication list given to you today. If you need a refill on your cardiac medications before your next appointment, please call your pharmacy.   Lab work: None ordered If you have labs (blood work) drawn today and your tests are completely normal, you will receive your results only by: Marland Kitchen MyChart Message (if you have MyChart) OR . A paper copy in the mail If you have any lab test that is abnormal or we need to change your treatment, we will call you to review the results.  Testing/Procedures: 1- Zio XT A zio monitor was placed today. It will remain on for 14 days. You will then return monitor and event diary in provided box. It takes 1-2 weeks for report to be downloaded and returned to [place]. We will call you with the results. If monitor falls of or has orange flashing light, please call Zio for further instructions.   Follow-Up: At [facility], you and your health needs are our priority.  As part of our continuing mission to provide you with exceptional heart care, we have created designated Provider Care Teams.  These Care Teams include your primary Cardiologist (physician) and Advanced Practice Providers (APPs -  Physician Assistants and Nurse Practitioners) who all work together to provide you with the care you need, when you need it. You will need a follow up appointment as needed.Please call our office 2 months in advance to schedule this appointment.  You may see Ida Rogue, MD or Murray Hodgkins, NP.

## 2019-07-05 ENCOUNTER — Other Ambulatory Visit: Payer: Self-pay

## 2019-07-05 ENCOUNTER — Encounter: Payer: Self-pay | Admitting: Family Medicine

## 2019-07-05 ENCOUNTER — Ambulatory Visit (INDEPENDENT_AMBULATORY_CARE_PROVIDER_SITE_OTHER): Payer: Medicare Other | Admitting: Family Medicine

## 2019-07-05 VITALS — BP 138/72 | HR 85 | Temp 97.8°F | Resp 14 | Ht 62.0 in | Wt 140.0 lb

## 2019-07-05 DIAGNOSIS — E7800 Pure hypercholesterolemia, unspecified: Secondary | ICD-10-CM | POA: Diagnosis not present

## 2019-07-05 DIAGNOSIS — I10 Essential (primary) hypertension: Secondary | ICD-10-CM | POA: Diagnosis not present

## 2019-07-05 DIAGNOSIS — R42 Dizziness and giddiness: Secondary | ICD-10-CM | POA: Diagnosis not present

## 2019-07-05 DIAGNOSIS — I700 Atherosclerosis of aorta: Secondary | ICD-10-CM | POA: Diagnosis not present

## 2019-07-05 DIAGNOSIS — K219 Gastro-esophageal reflux disease without esophagitis: Secondary | ICD-10-CM

## 2019-07-05 DIAGNOSIS — M810 Age-related osteoporosis without current pathological fracture: Secondary | ICD-10-CM

## 2019-07-05 DIAGNOSIS — K449 Diaphragmatic hernia without obstruction or gangrene: Secondary | ICD-10-CM

## 2019-07-05 DIAGNOSIS — R110 Nausea: Secondary | ICD-10-CM | POA: Insufficient documentation

## 2019-07-05 DIAGNOSIS — Z8673 Personal history of transient ischemic attack (TIA), and cerebral infarction without residual deficits: Secondary | ICD-10-CM

## 2019-07-05 MED ORDER — AMLODIPINE BESYLATE 10 MG PO TABS: 10.0000 mg | ORAL_TABLET | Freq: Every day | ORAL | 3 refills | Status: DC

## 2019-07-05 MED ORDER — ROSUVASTATIN CALCIUM 5 MG PO TABS: 5.0000 mg | ORAL_TABLET | Freq: Every day | ORAL | 3 refills | Status: DC

## 2019-07-05 MED ORDER — ONDANSETRON 4 MG PO TBDP: 4.0000 mg | ORAL_TABLET | Freq: Three times a day (TID) | ORAL | 1 refills | Status: AC | PRN

## 2019-07-05 NOTE — Progress Notes (Signed)
Name: Melinda Le   MRN: [id number]    DOB: Jun 23, 1941   Date:07/05/2019       Progress Note  Subjective  Chief Complaint  Chief Complaint  Patient presents with  . Hypertension    follow up  . Dizziness    recheck  . Follow-up    ER possible stroke Cardiology stated she need to be on Cholesterol medication    HPI  Pt presents for follow up:  Dizziness/Lightheadedness: She has had extensive evaluation for dizziness and lightheadedness ongoing for months.  Does get nausea with her lightheadedness and takes Zofran PRN which does help significantly. - Seeing Dr. Jennings Books with Neuro - last visit 06/21/2019 ; she was told dizziness is likely vestibular migraine, she did start vestibular therapy and is taking magnesium supplement. He reviewed her MRI/MRA brain with her at her last visit and he notes it showed some age related atrophy, bilateral carotid artery stenosis, and a small chronic left parietal cortical infarct.  Dr.  Manuella Ghazi did discuss surgical intervention, but she is not willing at this time. Has follow up October 2020.  - Seeing Murray Hodgkins with Cardiology - Last visit was yesterday 07/04/2019 - notes are reviewed in detail.  She notes goal BP is 120/80 and was told to possibly increase her amlodipine to 49m (taking 7.586m.  Her BP last night was 117/60, though she brings a log in with her of BP's over the last month, and she averages in the 130's/60-70's.  She also notes that she was told to start statin medication, but she is hesitant - after discussion she is open to trying 17m26mrestor, but does not want to go any higher on her dose. Taking ASA 51m58me will make these changes today.  She is on ZIO monitor right now.  She was recommended to see vascular for evaluation of carotid artery stenosis - there is a discordance between two recent studies in regard to the level of stenosis (her [place] and her recent MRA in August 2020).  She will message me if she would like  a referral later, though I do agree this could benefit her.  No follow up scheduled w/ Cardiology until after 14 day ZIO monitor. -------------- Prediabetes: She denies polydipsia, polyphagia, or polyuria; last A1C was normal  Osteoporosis Taking vitamin D supplement and tolerating well.  In vestibular migraines and balance. She is walking regularly; discussed weight bearing exercises.   GERD with Hiatal Hernia: Takes prevacid PRN when she has reflux, maybe 2-3 doses in a week.  Patient Active Problem List   Diagnosis Date Noted  . 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 Elevated rheumatoid factor 04/26/2018  . Crohn's ileitis, without complications (HCC)Amorita/[id number] forms of age-related cataract of left eye 01/24/2018  . Osteopenia of multiple sites 05/31/2017  . Prediabetes 08/07/2016  . Hiatal hernia with gastroesophageal reflux 03/29/2016  . Pure hypercholesterolemia 03/29/2016  . Essential (primary) hypertension 10/17/2013    Past Surgical History:  Procedure Laterality Date  . CARDIAC CATHETERIZATION     Pt doesn't recall date/no stents    Family History  Problem Relation Age of Onset  . Heart Problems Mother   . Breast cancer Neg Hx     Social History   Socioeconomic History  . Marital status: Married    Spouse name: DonaElenore RotaNumber of children: 3  . Years of education: Not on file  . Highest  education level: Not on file  Occupational History  . Not on file  Social Needs  . Financial resource strain: Not hard at all  . Food insecurity    Worry: Never true    Inability: Never true  . Transportation needs    Medical: No    Non-medical: No  Tobacco Use  . Smoking status: Never Smoker  . Smokeless tobacco: Never Used  Substance and Sexual Activity  . Alcohol use: Yes    Alcohol/week: 1.0 standard drinks    Types: 1 Glasses of wine per week    Frequency: Never  . Drug use: Never  . Sexual activity: Not  Currently  Lifestyle  . Physical activity    Days per week: Not on file    Minutes per session: Not on file  . Stress: Not on file  Relationships  . Social Herbalist on phone: Not on file    Gets together: Not on file    Attends religious service: Not on file    Active member of club or organization: Not on file    Attends meetings of clubs or organizations: Not on file    Relationship status: Not on file  . Intimate partner violence    Fear of current or ex partner: Not on file    Emotionally abused: Not on file    Physically abused: Not on file    Forced sexual activity: Not on file  Other Topics Concern  . Not on file  Social History Narrative  . Not on file     Current Outpatient Medications:  .  amLODipine (NORVASC) 2.5 MG tablet, Take 2.5 mg by mouth., Disp: , Rfl:  .  amLODipine (NORVASC) 5 MG tablet, Take 1 tablet (5 mg total) by mouth at bedtime., Disp: 15 tablet, Rfl: 0 .  BABY ASPIRIN PO, Take by mouth., Disp: , Rfl:  .  Cholecalciferol (VITAMIN D) 50 MCG (2000 UT) tablet, Take 2,000 Units by mouth daily., Disp: , Rfl:  .  fexofenadine (ALLEGRA) 180 MG tablet, Take 180 mg by mouth daily., Disp: , Rfl:  .  KRILL OIL PO, Take by mouth daily., Disp: , Rfl:  .  magnesium gluconate (MAGONATE) 500 MG tablet, Take 500 mg by mouth 2 (two) times daily., Disp: , Rfl:  .  ondansetron (ZOFRAN) 4 MG tablet, Take 1 tablet (4 mg total) by mouth every 8 (eight) hours as needed for nausea or vomiting. (Patient not taking: Reported on 07/05/2019), Disp: 30 tablet, Rfl: 0  Allergies  Allergen Reactions  . Penicillins Hives    I personally reviewed active problem list, medication list, allergies, notes from last encounter, lab results with the patient/caregiver today.   ROS  Ten systems reviewed and is negative except as mentioned in HPI  Objective  Vitals:   07/05/19 1040  BP: 138/72  Pulse: 85  Resp: 14  Temp: 97.8 F (36.6 C)  TempSrc: Oral  SpO2: 95%   Weight: 140 lb (63.5 kg)  Height: 5' 2"  (1.575 m)    Body mass index is 25.61 kg/m.  Physical Exam  Constitutional: Patient appears well-developed and well-nourished. No distress.  HENT: Head: Normocephalic and atraumatic.  Eyes: Conjunctivae and EOM are normal. No scleral icterus.  Neck: Normal range of motion. Neck supple. No JVD present. No thyromegaly present.  Cardiovascular: Normal rate, regular rhythm and normal heart sounds.  No murmur heard. No BLE edema. Pulmonary/Chest: Effort normal and breath sounds normal. No respiratory distress. Musculoskeletal:  Normal range of motion, no joint effusions. No gross deformities Neurological: Pt is alert and oriented to person, place, and time. No cranial nerve deficit. Coordination, balance, strength, speech and gait are normal.  Skin: Skin is warm and dry. No rash noted. No erythema.  Psychiatric: Patient has a normal mood and affect. behavior is normal. Judgment and thought content normal.  No results found for this or any previous visit (from the past 72 hour(s)).  PHQ2/9: Depression screen [facility] 2/9 07/05/2019 04/16/2019  Decreased Interest 0 0  Down, Depressed, Hopeless 0 0  PHQ - 2 Score 0 0  Altered sleeping 0 0  Tired, decreased energy 0 0  Change in appetite 0 0  Feeling bad or failure about yourself  0 0  Trouble concentrating 0 0  Moving slowly or fidgety/restless 0 0  Suicidal thoughts 0 0  PHQ-9 Score 0 0  Difficult doing work/chores Not difficult at all Not difficult at all   PHQ-2/9 Result is negative.    Fall Risk: Fall Risk  07/05/2019 04/16/2019  Falls in the past year? 0 0  Number falls in past yr: 0 0  Injury with Fall? 0 0  Follow up Falls evaluation completed Falls evaluation completed   Assessment & Plan  1. Lightheadedness - She is doing a bit better, she is looking forward to future sessions with vestibular therapy to help her symptoms; zofran PRN for nausea, continue follow up with Cardiology and  Neurology  2. Essential (primary) hypertension - Increase to 38m to obtain slightly lower goal of 120-130/60-70's for headaches. - amLODipine (NORVASC) 10 MG tablet; Take 1 tablet (10 mg total) by mouth daily.  Dispense: 90 tablet; Refill: 3  3. Pure hypercholesterolemia - rosuvastatin (CRESTOR) 5 MG tablet; Take 1 tablet (5 mg total) by mouth daily.  Dispense: 90 tablet; Refill: 3 - Recommend vascular referral for evaluation of possible carotid artery stenosis.  4. Aortic atherosclerosis (HCC) - Crestor 547m 5. Nausea - ondansetron (ZOFRAN ODT) 4 MG disintegrating tablet; Take 1 tablet (4 mg total) by mouth every 8 (eight) hours as needed for nausea or vomiting.  Dispense: 30 tablet; Refill: 1  6. History of stroke - Seeing neurology  7. Postmenopausal osteoporosis - Taking vitamin D supplement  8. Hiatal hernia with gastroesophageal reflux - Stable; PRN prevacid OTC

## 2019-07-05 NOTE — Patient Instructions (Signed)
-   Please send message on MyChart with your blood pressure readings in 2 weeks - just 2-3 readings each week.

## 2019-07-10 ENCOUNTER — Other Ambulatory Visit: Payer: Self-pay

## 2019-07-10 ENCOUNTER — Ambulatory Visit: Payer: Medicare Other | Attending: Neurology | Admitting: Physical Therapy

## 2019-07-10 VITALS — BP 168/64

## 2019-07-10 DIAGNOSIS — M6281 Muscle weakness (generalized): Secondary | ICD-10-CM | POA: Insufficient documentation

## 2019-07-10 DIAGNOSIS — R42 Dizziness and giddiness: Secondary | ICD-10-CM | POA: Insufficient documentation

## 2019-07-10 NOTE — Therapy (Signed)
[place] MAIN [facility] SERVICES [address] [place], [place], [address] Phone: [phone]   Fax:  [phone]  Physical Therapy Treatment  Patient Details  Name: Melinda Le MRN: [id number] Date of Birth: [birth year] Referring Provider (PT): Dr. Manuella Ghazi   Encounter Date: 07/10/2019  PT End of Session - 07/10/19 0946    Visit Number  4    Number of Visits  13    Date for PT Re-Evaluation  09/10/19    PT Start Time  0946    PT Stop Time  1031    PT Time Calculation (min)  45 min    Equipment Utilized During Treatment  Gait belt    Activity Tolerance  Patient tolerated treatment well    Behavior During Therapy  [facility] for tasks assessed/performed       Past Medical History:  Diagnosis Date  . Atypical chest pain    a. 2009 reportedly nl cath; b. 02/2015 Nl MV; c. 07/2015 Cath: nl cors [facility]).  . Crohn's ileitis, without complications ([place]) 04/08/6072  . Dizziness    a. 05/2019 Carotid U/S: Nl RICA. LICA 7-10%. Antegrade vertebral flow; b. 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 Apparent high-grade focal stenosis of petrous RICA and paraclinoid LICA. May be exagerrated by artifact.  Marland Kitchen Heart murmur   . History of echocardiogram    a. 10/2016 Echo: EF 65%, no rwma, Gr1 DD. Triv to Mild MR, Triv TR.  Marland Kitchen Hypertension   . Melanoma ([place]) 1998   Resected at [facility]  . Stroke [facility])    a. 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    Past Surgical History:  Procedure Laterality Date  . CARDIAC CATHETERIZATION     Pt doesn't recall date/no stents    Vitals:   07/10/19 0953  BP: (!) 168/64    Subjective Assessment - 07/10/19 0945    Subjective  Patient reports her blood pressure this morning 138/81 mm Hg. Patient states she woke up with a small indentation on outside of right eye and she is concerned that this could be from a mini-stroke. Patient reports that she is wondering if her dizziness is due to clogged  arteries rather than migraines and patient reports she is not having migraine pain. Patient does report that she has noticed a few times she has woken up in the middle of a night with a headache. Patient reports that she is wearing a heart monitor for two weeks. She has been wearing it since last Wednesday. Patient states she does not have a follow-up appointment with the cardiologist. Patient reports she has an appointment with a vascular interventionist at the end of October. Patient reports that she has been doing exercises faithfully and only missed one day. Patient states she was exhausted after her daughter visited last week so that is why she missed a day. Patient states she is dizzy off and on all day long since last week. Patient reports that she walks 10,000 steps a day. Patient reports she is still doing cooking, cleaning and states her husband does help, but that she is continuing to do these activities despite the dizziness. Patient reports she has not noticed any changes in her symptoms and if anything reports she feels her symptoms might have gotten a little worse. Patient reports she does not have dizziness in sitting only when she is up and moving. Patient does state that she does get dizziness after eating lunch and dinner.  Patient reports she usually makes a salad for lunch and gets dizzy after making it but says that has gotten a little better. Patient reports that after the last session when she went home that was the dizziest she has been. Patient reports the VOR X1 exercise does not recreate her dizziness but states she does see a double line. Patient states none of the HEP exercises recreate her dizziness.    Pertinent History  Pt states that she has been experiencing dizziness since May 1st, 2020.  She states that she was doing finanaces all day, and felt that this triggering her symptoms.  She has attempted to help with the finances monthly since that time, but states that it increases her  symptoms and she has been unable to complete them without her husbands help since May. She reports a burning sensation in the occiput area that refers posteriorly in her head, and sometimes brings on nausea. She denies a prior diagnosis of migraines, but states that in the late 1990's she used to get a headache daily in the PM, however attributes them to stress.  Denies migranous headache symptoms in past or now.  ENT did not think it was inner ear related, however pt states that she doesn't recall any VNG or oculomotor testing.  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  She has been referred to Dr. Sharlet Salina for neck pain from the ENT, but states that she has not had an office visit yet. Pt reports that she has a past history of labrynthitis that was diagnosed by her PCP after experiencing prolonged dizziness following a flight.    Limitations  House hold activities;Reading    Patient Stated Goals  decrease dizziness and burning sensation in the back of her head        Neuromuscular Re-education:  Patient reports that she is wondering if her dizziness is due to clogged arteries rather than migraines and patient reports she is not having migraine pain. Patient does report that she has noticed a few times she has woken up in the middle of a night with a headache. Patient reports that she is wearing a heart monitor for two weeks. She has been wearing it since last Wednesday. Patient states she does not have a follow-up appointment with the cardiologist. Patient reports she has an appointment with a vascular interventionist at the end of October. Patient reports that she has been doing exercises faithfully and only missed one day. Patient states she was exhausted after her daughter visited last week so that is why she missed a day. Patient states she is dizzy off and on all day long since last week. Patient reports that she walks 10,000 steps a day. Patient reports  she is still doing cooking, cleaning and states her husband does help, but that she is continuing to do these activities despite the dizziness. Patient reports she has not noticed any changes in her symptoms and if anything reports she feels her symptoms might have gotten a little worse. Patient reports she does not have dizziness in sitting only when she is up and moving. Patient does state that she does get dizziness after eating lunch and dinner. Patient reports she usually makes a salad for lunch and gets dizzy after making it but says that has gotten a little better. Patient reports that after the last session when she went home that was the dizziest she has been. Patient reports the  VOR X1 exercise does not recreate her dizziness but states she does see a double line. Patient states none of the HEP exercises recreate her dizziness while she is doing the exercises but states upon stopping she experiences mild dizziness.   Patient does report that when her daughter was visiting last week she was not as active doing her regular household chores and states she noticed she was not experiencing as much dizziness during that time.   VOR X 1 exercise:  Tried conflicting background exercise and patient reports this does cause dizziness symptoms. Added this progression to HEP.  Patient performed VOR X 1 horizontal in standing with conflicting background 3 reps of 1 minute each with verbal cues for technique.  Patient reports 2/10 and then 5/10 dizziness with this activity.   Hallway ball toss:  In hallway, worked on ball toss against one wall with alternating quick turns to toss ball against opposite wall while tracking with eyes and head. Patient reports this causes dizziness when she stops the movement.    Body Wall Rolls:  Patient performed 4 reps of supported, body wall rolls with eyes open.  Patient reports 5/10 dizziness with this activity.  Issued for HEP.   Patient reporting that VOR with  conflicting background and body wall roll activities recreated patient's dizziness. Therefore, added these exercises to home exercise program. Patient reports compliance with HEP. Patient would benefit from continued PT services to continue working towards goals and to try to decrease patient's subjective symptoms.   PT Education - 07/10/19 0945    Education Details  reviewed    Person(s) Educated  Patient    Methods  Explanation    Comprehension  Verbalized understanding       PT Short Term Goals - 07/11/19 1548      PT SHORT TERM GOAL #1   Title  Pt will be independent with HEP in order to improve strength and balance in order to decrease fall risk and improve function at home.    Time  6    Period  Weeks    Status  Achieved    Target Date  07/30/19        PT Long Term Goals - 06/18/19 1222      PT LONG TERM GOAL #1   Title  Pt wil report a 75% reduction in duration and/or frequency of symptoms in order to improve her QOL    Time  12    Period  Weeks    Status  New    Target Date  09/10/19      PT LONG TERM GOAL #2   Title  Pt will improve DGI by at least 3 points in order to demonstrate clinically significant improvement in balance and decreased risk for falls.    Baseline  06/18/19: 21/24    Time  12    Period  Weeks    Status  New    Target Date  09/10/19      PT LONG TERM GOAL #3   Title  Pt will improve ABC by at least 13% in order to demonstrate clinically significant improvement in balance confidence.    Baseline  06/18/19:67.81%    Time  12    Period  Weeks    Status  New    Target Date  09/10/19      PT LONG TERM GOAL #4   Title  Pt will decrease DHI score by at least 18 points in order to demonstrate clinically  significant reduction in disability    Baseline  06/18/19: 30/100    Time  12    Period  Weeks    Status  New    Target Date  09/10/19            Plan - 07/10/19 0946    Clinical Impression Statement  Patient reporting that VOR with  conflicting background and body wall roll activities recreated patient's dizziness. Therefore, added these exercises to home exercise program. Patient reports compliance with HEP. Patient would benefit from continued PT services to continue working towards goals and to try to decrease patient's subjective symptoms.    Personal Factors and Comorbidities  Age;Past/Current Experience;Behavior Pattern;Comorbidity 3+    Comorbidities  Hx of cancer, HTN, heart murmur    Examination-Activity Limitations  Bend;Reach Overhead    Examination-Participation Restrictions  Cleaning;Meal Prep;Personal Finances    Stability/Clinical Decision Making  Evolving/Moderate complexity    Rehab Potential  Fair    PT Frequency  1x / week    PT Duration  12 weeks    PT Treatment/Interventions  ADLs/Self Care Home Management;Canalith Repostioning;Cryotherapy;Electrical Stimulation;Moist Heat;Traction;Ultrasound;Functional mobility training;Therapeutic activities;Therapeutic exercise;Balance training;Neuromuscular re-education;Patient/family education;Manual techniques;Passive range of motion;Dry needling;Vestibular;Visual/perceptual remediation/compensation;Iontophoresis 59m/ml Dexamethasone;Spinal Manipulations;Joint Manipulations    PT Next Visit Plan  dynamic visual acuity and a more indepth cervical screening    PT Home Exercise Plan  none given today    Consulted and Agree with Plan of Care  Patient       Patient will benefit from skilled therapeutic intervention in order to improve the following deficits and impairments:  Dizziness, Decreased range of motion, Impaired perceived functional ability, Decreased strength, Postural dysfunction, Pain  Visit Diagnosis: Dizziness and giddiness  Muscle weakness (generalized)     Problem List Patient Active Problem List   Diagnosis Date Noted  . Lightheadedness 07/05/2019  . Aortic atherosclerosis ([place] 07/05/2019  . Nausea 07/05/2019  . History of stroke 07/05/2019   . 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 Elevated rheumatoid factor 04/26/2018  . Combined forms of age-related cataract of left eye 01/24/2018  . Status post LASIK surgery 01/24/2018  . 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 Prediabetes 08/07/2016  . Hiatal hernia with gastroesophageal reflux 03/29/2016  . Pure hypercholesterolemia 03/29/2016  . Essential (primary) hypertension 10/17/2013   DLady DeutscherPT, DPT #[id number] Deutscher9/12/2018, 3:55 PM  [place] [facility] [address] [place] [address]Phone: [phone]  Fax:  [phone] Name: SMiyanna WiersmaMRN: [id number] of Birth: [birth year]

## 2019-07-11 ENCOUNTER — Encounter: Payer: Self-pay | Admitting: Physical Therapy

## 2019-07-17 ENCOUNTER — Ambulatory Visit: Payer: Medicare Other | Admitting: Physical Therapy

## 2019-07-17 ENCOUNTER — Encounter: Payer: Self-pay | Admitting: Physical Therapy

## 2019-07-17 ENCOUNTER — Other Ambulatory Visit: Payer: Self-pay

## 2019-07-17 DIAGNOSIS — M6281 Muscle weakness (generalized): Secondary | ICD-10-CM

## 2019-07-17 DIAGNOSIS — R42 Dizziness and giddiness: Secondary | ICD-10-CM | POA: Diagnosis not present

## 2019-07-17 NOTE — Therapy (Signed)
[place] MAIN [facility] SERVICES [address] [place], [place], [address] Phone: [phone]   Fax:  [phone]  Physical Therapy Treatment  Patient Details  Name: Melinda Le MRN: [id number] Date of Birth: 03-Apr-1941 Referring Provider (PT): Dr. Manuella Ghazi   Encounter Date: 07/17/2019  PT End of Session - 07/17/19 0943    Visit Number  5    Number of Visits  13    Date for PT Re-Evaluation  09/10/19    PT Start Time  0943    PT Stop Time  1030    PT Time Calculation (min)  47 min    Equipment Utilized During Treatment  Gait belt    Activity Tolerance  Patient tolerated treatment well    Behavior During Therapy  [facility] for tasks assessed/performed       Past Medical History:  Diagnosis Date  . Atypical chest pain    a. 2009 reportedly nl cath; b. 02/2015 Nl MV; c. 07/2015 Cath: nl cors [facility]).  . Crohn's ileitis, without complications ([place]) 0/0/1749  . Dizziness    a. 05/2019 Carotid U/S: Nl RICA. LICA 4-49%. Antegrade vertebral flow; b. 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 Apparent high-grade focal stenosis of petrous RICA and paraclinoid LICA. May be exagerrated by artifact.  Marland Kitchen Heart murmur   . History of echocardiogram    a. 10/2016 Echo: EF 65%, no rwma, Gr1 DD. Triv to Mild MR, Triv TR.  Marland Kitchen Hypertension   . Melanoma ([place]) 1998   Resected at [facility]  . Stroke [facility])    a. 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    Past Surgical History:  Procedure Laterality Date  . CARDIAC CATHETERIZATION     Pt doesn't recall date/no stents    There were no vitals filed for this visit.  Subjective Assessment - 07/17/19 0942    Subjective  Patient reports when she did the VOR with conflicting background when she turned her head to the right it jumped and did not jump when turning to the left states and patient reported when she slowed her head movement down the target did not jump. Patient yesterday morning  when she did the VOR with conflicting background she got mildly nauseous. Patient reports the body wall roll was no problem at home. Patient states the rolling makes her dizzy and she feels this exercise is helping her. Patient states her symptoms are unpredictable and she will have some good days and then the next day she will have the dizziness back again. Patient reports she was dizzy all day after she got home after the last PT visit. Patient states she does feel she is mildly improving overall since starting therapy.    Pertinent History  Pt states that she has been experiencing dizziness since May 1st, 2020.  She states that she was doing finanaces all day, and felt that this triggering her symptoms.  She has attempted to help with the finances monthly since that time, but states that it increases her symptoms and she has been unable to complete them without her husbands help since May. She reports a burning sensation in the occiput area that refers posteriorly in her head, and sometimes brings on nausea. She denies a prior diagnosis of migraines, but states that in the late 1990's she used to get a headache daily in the PM, however attributes them to stress.  Denies migranous headache symptoms in past or now.  ENT did  not think it was inner ear related, however pt states that she doesn't recall any VNG or oculomotor testing.  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  She has been referred to Dr. Sharlet Salina for neck pain from the ENT, but states that she has not had an office visit yet. Pt reports that she has a past history of labrynthitis that was diagnosed by her PCP after experiencing prolonged dizziness following a flight.    Limitations  House hold activities;Reading    Patient Stated Goals  decrease dizziness and burning sensation in the back of her head       Patient reports when she did the VOR with conflicting background when she turned her head to  the right it jumped and did not jump when turning to the left states and patient reported when she slowed her head movement down the target did not jump. Patient yesterday morning when she did the VOR with conflicting background she got mildly nauseous. Patient reports the body wall roll was no problem at home. Patient states the rolling makes her dizzy and she feels this exercise is helping her. Patient states her symptoms are unpredictable and she will have some good days and then the next day she will have the dizziness back again. Patient reports she was dizzy all day after she got home after the last PT visit. Patient states she does feel she is mildly improving overall since starting therapy.    Neuromuscular Re-education:  VOR X 1 exercise:  Patient performed VOR X 1 horizontal in standing with conflicting background 2 reps of 1 minute each with verbal cues for technique as initially patient was moving her head side to side with too much excursion and eyes not staying on the target at times. Once demonstrated proper technique again, patient was able to perform correctly. Patient reports dizziness with this activity.  Patient then, performed VOR X 1 while ambulating 20' towards target on conflicting background multiple reps. Patient noted to have slight veering and increased sway at times and patient reports dizziness with this activity.   Bounce Passes: Patient performed ambulation 49' trials while doing alternating sides bounce passes to self with ball while tracking ball with eyes and head with CGA.  Patient did not have veering with this exercise.    Diona Foley toss over shoulder: Patient performed multiple 35' trials of forward and retro ambulation while tossing ball over one shoulder with return catch over opposite shoulder with CGA.  Patient performed multiple 75' trials of forward ambulation while tossing ball over one shoulder with return catch over opposite shoulder varying the ball position  to head, shoulder and waist level to promote head turning and tilting. Patient required short, seated rest break after ambulation with varying ball position secondary to imbalance and dizziness. Patient was noted to have more imbalance when varying the ball position and patient reported increased dizziness.    Body Wall Rolls:  Patient performed 3 reps of supported, body wall rolls with eyes open.  Patient performed 4 reps of supported, body wall rolls with eyes closed with SBA.  Patient noted to have increased sway when she stopped moving. Patient reports dizziness and imbalance with this activity.    PT Education - 07/17/19 [phone]    Education Details  reviewed proper technique with VOR X1 and gave progression of VOR X 1 walking body wall rolls with eyes closed with assistance of patient's husband to  guard for safety    Person(s) Educated  Patient    Methods  Explanation;Verbal cues;Demonstration    Comprehension  Verbalized understanding;Returned demonstration       PT Short Term Goals - 07/11/19 1548      PT SHORT TERM GOAL #1   Title  Pt will be independent with HEP in order to improve strength and balance in order to decrease fall risk and improve function at home.    Time  6    Period  Weeks    Status  Achieved    Target Date  07/30/19        PT Long Term Goals - 06/18/19 1222      PT LONG TERM GOAL #1   Title  Pt wil report a 75% reduction in duration and/or frequency of symptoms in order to improve her QOL    Time  12    Period  Weeks    Status  New    Target Date  09/10/19      PT LONG TERM GOAL #2   Title  Pt will improve DGI by at least 3 points in order to demonstrate clinically significant improvement in balance and decreased risk for falls.    Baseline  06/18/19: 21/24    Time  12    Period  Weeks    Status  New    Target Date  09/10/19      PT LONG TERM GOAL #3   Title  Pt will improve ABC by at least 13% in order to demonstrate clinically significant  improvement in balance confidence.    Baseline  06/18/19:67.81%    Time  12    Period  Weeks    Status  New    Target Date  09/10/19      PT LONG TERM GOAL #4   Title  Pt will decrease DHI score by at least 18 points in order to demonstrate clinically significant reduction in disability    Baseline  06/18/19: 30/100    Time  12    Period  Weeks    Status  New    Target Date  09/10/19            Plan - 07/17/19 0943    Clinical Impression Statement  Patient required reeducation as to proper technique with VOR X 1 exercise and then patient was able to perform correctly. Patient able to advance to walking VOR X1 with conflicting background this date. Patient reported dizziness while performing body wall rolls with EO/EC, VOR X 1 and with ambulation with ball toss over shoulder exercises this date. Patient would benefit from continued PT services to work toward goals and to try to reduce patient's symptoms of dizziness.    Personal Factors and Comorbidities  Age;Past/Current Experience;Behavior Pattern;Comorbidity 3+    Comorbidities  Hx of cancer, HTN, heart murmur    Examination-Activity Limitations  Bend;Reach Overhead    Examination-Participation Restrictions  Cleaning;Meal Prep;Personal Finances    Stability/Clinical Decision Making  Evolving/Moderate complexity    Rehab Potential  Fair    PT Frequency  1x / week    PT Duration  12 weeks    PT Treatment/Interventions  ADLs/Self Care Home Management;Canalith Repostioning;Cryotherapy;Electrical Stimulation;Moist Heat;Traction;Ultrasound;Functional mobility training;Therapeutic activities;Therapeutic exercise;Balance training;Neuromuscular re-education;Patient/family education;Manual techniques;Passive range of motion;Dry needling;Vestibular;Visual/perceptual remediation/compensation;Iontophoresis 27m/ml Dexamethasone;Spinal Manipulations;Joint Manipulations    PT Next Visit Plan  dynamic visual acuity and a more indepth cervical  screening    PT Home Exercise Plan  none given  today    Consulted and Agree with Plan of Care  Patient       Patient will benefit from skilled therapeutic intervention in order to improve the following deficits and impairments:  Dizziness, Decreased range of motion, Impaired perceived functional ability, Decreased strength, Postural dysfunction, Pain  Visit Diagnosis: Dizziness and giddiness  Muscle weakness (generalized)     Problem List Patient Active Problem List   Diagnosis Date Noted  . Lightheadedness 07/05/2019  . Aortic atherosclerosis ([place]) 07/05/2019  . Nausea 07/05/2019  . History of stroke 07/05/2019  . 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 Elevated rheumatoid factor 04/26/2018  . Combined forms of age-related cataract of left eye 01/24/2018  . Status post LASIK surgery 01/24/2018  . 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 Prediabetes 08/07/2016  . Hiatal hernia with gastroesophageal reflux 03/29/2016  . Pure hypercholesterolemia 03/29/2016  . Essential (primary) hypertension 10/17/2013   Lady Deutscher PT, DPT [phone] Lady Deutscher 07/17/2019, 4:19 PM  [place] MAIN [facility] SERVICES [address] [place], [place], [address] Phone: [phone]   Fax:  [phone]  Name: Dustie Brittle MRN: [id number] Date of Birth: [birth year]

## 2019-07-24 ENCOUNTER — Ambulatory Visit: Payer: Medicare Other | Admitting: Physical Therapy

## 2019-07-25 ENCOUNTER — Encounter: Payer: Self-pay | Admitting: Physical Therapy

## 2019-07-25 ENCOUNTER — Ambulatory Visit: Payer: Medicare Other | Admitting: Physical Therapy

## 2019-07-25 ENCOUNTER — Other Ambulatory Visit: Payer: Self-pay

## 2019-07-25 DIAGNOSIS — R42 Dizziness and giddiness: Secondary | ICD-10-CM | POA: Diagnosis not present

## 2019-07-25 DIAGNOSIS — M6281 Muscle weakness (generalized): Secondary | ICD-10-CM

## 2019-07-25 NOTE — Therapy (Signed)
[facility] [address] [place], [place], [address] Phone: [phone]   Fax:  [phone]  Physical Therapy Treatment/ Discharge Summary Dates of service: 06/18/2019-07/25/2019 Visits from Start of Care: 6 visits  Patient Details  Name: Melinda Le MRN: [id number] Date of Birth: 09/15/1941 Referring Provider (PT): Dr. Shah   Encounter Date: 07/25/2019  PT End of Session - 07/25/19 1501    Visit Number  6    Number of Visits  13    Date for PT Re-Evaluation  09/10/19    PT Start Time  1501    PT Stop Time  1545    PT Time Calculation (min)  44 min    Equipment Utilized During Treatment  Gait belt    Activity Tolerance  Patient tolerated treatment well    Behavior During Therapy  WFL for tasks assessed/performed       Past Medical History:  Diagnosis Date  . Atypical chest pain    a. 2009 reportedly nl cath; b. 02/2015 Nl MV; c. 07/2015 Cath: nl cors ([facility]).  . Crohn's ileitis, without complications (HCC) 04/10/2018  . Dizziness    a. 05/2019 Carotid U/S: Nl RICA. LICA 1-39%. Antegrade vertebral flow; b. 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 Apparent high-grade focal stenosis of petrous RICA and paraclinoid LICA. May be exagerrated by artifact.  . Heart murmur   . History of echocardiogram    a. 10/2016 Echo: EF 65%, no rwma, Gr1 DD. Triv to Mild MR, Triv TR.  . Hypertension   . Melanoma (HCC) 1998   Resected at [facility]  . Stroke (HCC)    a. 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    Past Surgical History:  Procedure Laterality Date  . CARDIAC CATHETERIZATION     Pt doesn't recall date/no stents    There were no vitals filed for this visit.  Subjective Assessment - 07/25/19 1501    Subjective  Patient states she has had more symptoms this past week. Patient states "Wednesday was bad, Friday okay, Saturday was horrible and Sunday was fair." Patient states on Monday,  she was watering plants and began to have shaking, her head was hurting and she felt so badly that she had to go inside to lie down. Patient reports she rarely needs to lie down due to her symptoms. Patient states since this bad episode on Monday, she is getting dizzy all the time now when she moves her head. Patient states she did her HEP every day except Monday. Patient reports she has been getting headaches. Patient states when her occipital symptoms improved for a little bit recently she felt her dizziness and balance improved, but states now that her occipital symptoms have worsened again she started to have more dizziness again.    Pertinent History  Pt states that she has been experiencing dizziness since May 1st, 2020.  She states that she was doing finanaces all day, and felt that this triggering her symptoms.  She has attempted to help with the finances monthly since that time, but states that it increases her symptoms and she has been unable to complete them without her husbands help since May. She reports a burning sensation in the occiput area that refers posteriorly in her head, and sometimes brings on nausea. She denies a prior diagnosis of migraines, but states that in the late 1990's she used to get a headache daily in the PM, however attributes them   to stress.  Denies migranous headache symptoms in past or now.  ENT did not think it was inner ear related, however pt states that she doesn't recall any VNG or oculomotor testing.  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  She has been referred to Dr. Sharlet Salina for neck pain from the ENT, but states that she has not had an office visit yet. Pt reports that she has a past history of labrynthitis that was diagnosed by her PCP after experiencing prolonged dizziness following a flight.    Limitations  House hold activities;Reading    Patient Stated Goals  decrease dizziness and burning sensation in the back  of her head         [facility] PT Assessment - 07/25/19 1516      Dynamic Gait Index   Level Surface  Normal    Change in Gait Speed  Normal    Gait with Horizontal Head Turns  Normal    Gait with Vertical Head Turns  Mild Impairment   mild dizziness   Gait and Pivot Turn  Normal   mild dizziness when turned to right   Step Over Obstacle  Normal    Step Around Obstacles  Normal    Steps  Normal    Total Score  23         Neuromuscular Re-education: Patient states she has had more symptoms this past week. Patient states "Wednesday was bad, Friday okay, Saturday was horrible and Sunday was fair." Patient states on Monday, she was watering plants and began to have shaking, her head was hurting and she felt so badly that she had to go inside to lie down. Patient reports she rarely needs to lie down due to her symptoms. Patient states since this bad episode on Monday, she is getting dizzy all the time now when she moves her head. Patient states she did her HEP every day except Monday. Patient reports she has been getting headaches. Patient states when her occipital symptoms improved for a little bit recently she felt her dizziness and balance improved, but states now that her occipital symptoms have worsened again she started to have more dizziness again.   FUNCTIONAL OUTCOME MEASURES:  Results Comments  DHI 26/100 Low perception of handicap  ABC Scale 81.2% Low falls risk  DGI 23/24 Safe for community amb   Note: After performing the DGI, patient reports her eyes are burning and her head feels full.  Spent increased time discussing patient's functional outcome testing and progress towards goals as well as discussing plan of care.  Patient has achieved short term goal of independence with HEP and reports that she has been consistently performing HEP daily. Patient reports 10% improvement in her symptoms overall since initiating therapy and therefore did not meet long term goal #1. Patient  reports that she has begun to have more occipital headaches again which have increased her dizziness. Patient met long term goal of improving by 13% or more on the ABC scale. Patient's ABC score went from 67.8% to 81.2% which indicates low falls risk. Patient partially met remaining two long term goals. Patient scored 23/24 on the DGI and 26/100 indicating low perception of handicap on the Dizziness Handicap Inventory. Patient plans to continue to perform home exercise program upon discharge. Encouraged patient to follow-up with her physicians in regards to her symptoms. Patient reports that she is considering getting an occipital nerve block injection to  see if that would help alleviate some of her symptoms. Patient states she will also follow-up with a vascular interventionist physician. Patient in agreement with discharge from PT services at this time.    PT Education - 07/25/19 1501    Education Details  Discussed functional outcome measure retesting and compared to prior tests; discussed progress towards goals and reviewed goals; discussed discharge plans; recommended patient continue to follow-up with her physicians in regards to her symptoms.    Person(s) Educated  Patient    Methods  Explanation    Comprehension  Verbalized understanding       PT Short Term Goals - 07/11/19 1548      PT SHORT TERM GOAL #1   Title  Pt will be independent with HEP in order to improve strength and balance in order to decrease fall risk and improve function at home.    Time  6    Period  Weeks    Status  Achieved    Target Date  07/30/19        PT Long Term Goals - 07/25/19 1527      PT LONG TERM GOAL #1   Title  Pt wil report a 75% reduction in duration and/or frequency of symptoms in order to improve her QOL    Baseline  reports 10% on 07/25/2019    Time  12    Period  Weeks    Status  Not Met      PT LONG TERM GOAL #2   Title  Pt will improve DGI by at least 3 points in order to demonstrate  clinically significant improvement in balance and decreased risk for falls.    Baseline  06/18/19: 21/24; scored 23/24 on 07/25/2019    Time  12    Period  Weeks    Status  Partially Met      PT LONG TERM GOAL #3   Title  Pt will improve ABC by at least 13% in order to demonstrate clinically significant improvement in balance confidence.    Baseline  06/18/19:67.81%; scored 81.25% on 07/25/2019    Time  12    Period  Weeks    Status  Achieved      PT LONG TERM GOAL #4   Title  Pt will decrease DHI score by at least 18 points in order to demonstrate clinically significant reduction in disability    Baseline  06/18/19: 30/100; scored 26/100 on 07/25/2019    Time  12    Period  Weeks    Status  Partially Met            Plan - 07/25/19 1502    Clinical Impression Statement  Patient has achieved short term goal of independence with HEP and reports that she has been consistently performing HEP daily. Patient reports 10% improvement in her symptoms overall since initiating therapy and therefore did not meet long term goal #1. Patient reports that she has begun to have more occipital headaches again which have increased her dizziness. Patient met long term goal of improving by 13% or more on the ABC scale. Patient's ABC score went from 67.8% to 81.2% which indicates low falls risk. Patient partially met remaining two long term goals. Patient scored 23/24 on the DGI and 26/100 indicating low perception of handicap on the Dizziness Handicap Inventory. Patient plans to continue to perform home exercise program upon discharge. Encouraged patient to follow-up with her physicians in regards to her symptoms. Patient reports that she   is considering getting an occipital nerve block injection to see if that would help alleviate some of her symptoms. Patient states she will also follow-up with a vascular interventionist physician. Patient in agreement with discharge from PT services at this time.    Personal  Factors and Comorbidities  Age;Past/Current Experience;Behavior Pattern;Comorbidity 3+    Comorbidities  Hx of cancer, HTN, heart murmur    Examination-Activity Limitations  Bend;Reach Overhead    Examination-Participation Restrictions  Cleaning;Meal Prep;Personal Finances    Stability/Clinical Decision Making  Evolving/Moderate complexity    Rehab Potential  Fair    PT Frequency  1x / week    PT Duration  12 weeks    PT Treatment/Interventions  ADLs/Self Care Home Management;Canalith Repostioning;Cryotherapy;Electrical Stimulation;Moist Heat;Traction;Ultrasound;Functional mobility training;Therapeutic activities;Therapeutic exercise;Balance training;Neuromuscular re-education;Patient/family education;Manual techniques;Passive range of motion;Dry needling;Vestibular;Visual/perceptual remediation/compensation;Iontophoresis 4mg/ml Dexamethasone;Spinal Manipulations;Joint Manipulations    PT Next Visit Plan  dynamic visual acuity and a more indepth cervical screening    PT Home Exercise Plan  none given today    Consulted and Agree with Plan of Care  Patient       Patient will benefit from skilled therapeutic intervention in order to improve the following deficits and impairments:  Dizziness, Decreased range of motion, Impaired perceived functional ability, Decreased strength, Postural dysfunction, Pain  Visit Diagnosis: Dizziness and giddiness  Muscle weakness (generalized)     Problem List Patient Active Problem List   Diagnosis Date Noted  . Lightheadedness 07/05/2019  . Aortic atherosclerosis (HCC) 07/05/2019  . Nausea 07/05/2019  . History of stroke 07/05/2019  . 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 Elevated rheumatoid factor 04/26/2018  . Combined forms of age-related cataract of left eye 01/24/2018  . Status post LASIK surgery 01/24/2018  . 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 Prediabetes 08/07/2016  . Hiatal hernia with  gastroesophageal reflux 03/29/2016  . Pure hypercholesterolemia 03/29/2016  . Essential (primary) hypertension 10/17/2013     PT, DPT #[id number] , 07/26/2019, 11:36 AM   [facility] [address] [place], [place], [address] Phone: [phone]   Fax:  [phone]  Name: Shavette Stitt MRN: [id number] Date of Birth: 12/17/1940   

## 2019-07-31 ENCOUNTER — Telehealth: Payer: Self-pay

## 2019-07-31 ENCOUNTER — Ambulatory Visit: Payer: Medicare Other | Admitting: Physical Therapy

## 2019-07-31 DIAGNOSIS — I472 Ventricular tachycardia, unspecified: Secondary | ICD-10-CM

## 2019-07-31 MED ORDER — METOPROLOL SUCCINATE ER 25 MG PO TB24: 12.5000 mg | ORAL_TABLET | Freq: Every day | ORAL | 3 refills | Status: DC

## 2019-07-31 NOTE — Telephone Encounter (Signed)
Call to patient to discuss monitor results.   Placed order for myoview and toprol XL.   Pt agreed to take BP for 1 week after starting metoprolol. She will call us back with data. She also wanted to report that amlodipine was increases by PCP at last OV. Now taking 10 mg total once daily.   Reviewed instructions verbally and I will mail as well.   Jessie  Your caregiver has ordered a Stress Test with nuclear imaging. The purpose of this test is to evaluate the blood supply to your heart muscle. This procedure is referred to as a "Non-Invasive Stress Test." This is because other than having an IV started in your vein, nothing is inserted or "invades" your body. Cardiac stress tests are done to find areas of poor blood flow to the heart by determining the extent of coronary artery disease (CAD). Some patients exercise on a treadmill, which naturally increases the blood flow to your heart, while others who are  unable to walk on a treadmill due to physical limitations have a pharmacologic/chemical stress agent called Lexiscan . This medicine will mimic walking on a treadmill by temporarily increasing your coronary blood flow.   Please note: these test may take anywhere between 2-4 hours to complete  PLEASE REPORT TO [facility] AT THE FIRST DESK WILL DIRECT YOU WHERE TO GO  Date of Procedure:_____________________________________  Arrival Time for Procedure:______________________________  Instructions regarding medication:   __x_:  Hold betablocker(s) night before procedure and morning of procedure   PLEASE NOTIFY THE OFFICE AT LEAST 24 HOURS IN ADVANCE IF YOU ARE UNABLE TO KEEP YOUR APPOINTMENT.  [phone] AND  PLEASE NOTIFY NUCLEAR MEDICINE AT [facility] AT LEAST 24 HOURS IN ADVANCE IF YOU ARE UNABLE TO KEEP YOUR APPOINTMENT. [phone]  How to prepare for your Myoview test:  1. Do not eat or drink after midnight 2. No caffeine for 24 hours prior to  test 3. No smoking 24 hours prior to test. 4. Your medication may be taken with water.  If your doctor stopped a medication because of this test, do not take that medication. 5. Ladies, please do not wear dresses.  Skirts or pants are appropriate. Please wear a short sleeve shirt. 6. No perfume, cologne or lotion. 7. Wear comfortable walking shoes. No heels!   Message sent to precert and scheduling.   Follow up OV scheduled for Oct 13th with Dr. Rockey Situ

## 2019-07-31 NOTE — Telephone Encounter (Signed)
-----   Message from Theora Gianotti, NP sent at 07/30/2019  8:33 AM EDT ----- No afib noted.  Brief episodes of increased heart rates coming from both the top chambers (9 episodes) and bottom chambers (2 episodes).  Triggered events were not associated w/ any significant arrhythmia.  Though we commonly see extra beats from the top chambers, fast heart rates from the bottom chambers (ventricular tachycardia) can be problematic and/or a warning sign r/t heart disease.  In that setting, I recommend a lexiscan myoview to r/o ischemia.  Also please add toprol xl 12.5 mg daily.  This will help to treat fast heart beats.  May have to reduce amlodipine depending on how bp responds to low dose toprol.  Pls arrange for f/u about a week after myoview to re-eval (me or TG).

## 2019-08-07 ENCOUNTER — Ambulatory Visit: Payer: Medicare Other | Admitting: Physical Therapy

## 2019-08-08 ENCOUNTER — Other Ambulatory Visit: Payer: Self-pay

## 2019-08-08 ENCOUNTER — Encounter
Admission: RE | Admit: 2019-08-08 | Discharge: 2019-08-08 | Disposition: A | Discharge status: Home / Self Care | Payer: Medicare Other | Source: Ambulatory Visit | Attending: Nurse Practitioner | Admitting: Nurse Practitioner

## 2019-08-08 DIAGNOSIS — I472 Ventricular tachycardia, unspecified: Secondary | ICD-10-CM

## 2019-08-08 DIAGNOSIS — I10 Essential (primary) hypertension: Secondary | ICD-10-CM | POA: Insufficient documentation

## 2019-08-08 DIAGNOSIS — Z79899 Other long term (current) drug therapy: Secondary | ICD-10-CM | POA: Diagnosis not present

## 2019-08-08 DIAGNOSIS — Z8673 Personal history of transient ischemic attack (TIA), and cerebral infarction without residual deficits: Secondary | ICD-10-CM | POA: Diagnosis not present

## 2019-08-08 DIAGNOSIS — E785 Hyperlipidemia, unspecified: Secondary | ICD-10-CM | POA: Insufficient documentation

## 2019-08-08 DIAGNOSIS — Z7982 Long term (current) use of aspirin: Secondary | ICD-10-CM | POA: Diagnosis not present

## 2019-08-08 MED ORDER — REGADENOSON 0.4 MG/5ML IV SOLN
0.4000 mg | Freq: Once | INTRAVENOUS | Status: AC
  Administered 2019-08-08: 11:00:00 0.4 mg via INTRAVENOUS

## 2019-08-08 MED ORDER — TECHNETIUM TC 99M TETROFOSMIN IV KIT
30.0000 | PACK | Freq: Once | INTRAVENOUS | Status: AC | PRN
  Administered 2019-08-08: 30.77 via INTRAVENOUS

## 2019-08-08 MED ORDER — TECHNETIUM TC 99M TETROFOSMIN IV KIT
10.0000 | PACK | Freq: Once | INTRAVENOUS | Status: AC | PRN
  Administered 2019-08-08: 10.75 via INTRAVENOUS

## 2019-08-09 ENCOUNTER — Telehealth: Payer: Self-pay | Admitting: Cardiovascular Disease

## 2019-08-09 NOTE — Telephone Encounter (Signed)
Patient made aware.

## 2019-08-09 NOTE — Telephone Encounter (Signed)
Patient calling to discuss recent stress testing results   Please call

## 2019-08-10 LAB — NM MYOCAR MULTI W/SPECT W/WALL MOTION / EF
LV dias vol: 71 mL (ref 46–106)
LV sys vol: 19 mL
Peak HR: 117 {beats}/min
Rest HR: 73 {beats}/min
SDS: 3
SRS: 0
SSS: 3
TID: 0.77

## 2019-08-19 NOTE — Progress Notes (Signed)
Cardiology Office Note  Date:  08/21/2019   ID:  Melinda Le, DOB 07/06/1941, MRN [id number]  PCP:  Melinda Hartshorn, FNP   Chief Complaint  Patient presents with  . Other    Follow up post myoview and ZIO. Patient c.o chest heaviness a few weeks ago and c/o dizziness for 5 1/2 months. Meds reviewed verbally with patient.     HPI:  [name] is a 78 year old woman with past medical history of Anxiety No coronary artery disease, ejection fraction 65% noted on cardiac cath in September 2016. No significant valvular disease noted on echocardiogram in 2014 at [facility]. remote stroke on MRI  vestibular migraines  Who presents for follow-up of her dizziness, arrhythmia, carotid disease  Discussion of recent results with her Stress test 08/10/2019 reviewed Pharmacological myocardial perfusion imaging study with no significant  ischemia Normal wall motion, EF estimated at 83% No EKG changes concerning for ischemia at peak stress or in recovery. 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  2 Ventricular Tachycardia runs occurred, very short, no symptoms 9 Supraventricular Tachycardia /atrial tachycardia runs occurred, very short, no symptoms No atrial fibrillation  Still dizzy, Particular when she bends her head downward  EKG personally reviewed by myself on todays visit Shows NSR rate 72 bpm  No past medical history reviewed  episodes occurring intermittently since May 1.  Typical episode she has nausea,  Starts when looking down, bending neck posterior neck  and progresses to the entire head.    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  Recently seen in the emergency room Apr 08, 2019 for lightheadedness Woke up with sx, dizzy, legs shaky,  ER visit on 04/08/2019 - EKG normal, CBC and BMP were non-contributory, troponin was negative, chest Xray was non-contributory (does have aortic atherosclerosis), CT head was negative for acute abnormality.  No MRI was performed; give meclizine and valium.  She reports meclizine does not help.   PMH:   has a past medical history of Atypical chest pain, Crohn's ileitis, without complications ([place]) (1/0/6269), Dizziness, Heart murmur, History of echocardiogram, Hypertension, Melanoma ([place]) (1998), and Stroke ([place]).  PSH:    Past Surgical History:  Procedure Laterality Date  . CARDIAC CATHETERIZATION     Pt doesn't recall date/no stents    Current Outpatient Medications  Medication Sig Dispense Refill  . amLODipine (NORVASC) 10 MG tablet Take 1 tablet (10 mg total) by mouth daily. 90 tablet 3  . BABY ASPIRIN PO Take by mouth.    . Cholecalciferol (VITAMIN D) 50 MCG (2000 UT) tablet Take 2,000 Units by mouth daily.    . fexofenadine (ALLEGRA) 180 MG tablet Take 180 mg by mouth daily.    Marland Kitchen KRILL OIL PO Take by mouth daily.    . lansoprazole (PREVACID) 15 MG capsule Take 15 mg by mouth daily as needed.    . magnesium gluconate (MAGONATE) 500 MG tablet Take 400 mg by mouth 2 (two) times daily.     . metoprolol succinate (TOPROL-XL) 25 MG 24 hr tablet Take 0.5 tablets (12.5 mg total) by mouth daily.  Take with or immediately following a meal. 45 tablet 3  . ondansetron (ZOFRAN ODT) 4 MG disintegrating tablet Take 1 tablet (4 mg total) by mouth every 8 (eight) hours as needed for nausea or vomiting. 30 tablet 1  . rosuvastatin (CRESTOR) 5 MG tablet Take 1 tablet (5 mg total) by mouth daily. 90 tablet 3   No current facility-administered medications for this visit.       Allergies:   Penicillins   Social History:  The patient  reports that she has never smoked. She has never used smokeless tobacco. She reports current alcohol use of about 1.0 standard drinks of alcohol per week. She reports that she does not use drugs.   Family History:   family history includes Heart Problems in her mother.    Review of Systems: Review of Systems  Constitutional: Negative.   Respiratory: Negative.   Cardiovascular: Negative.   Gastrointestinal: Negative.   Musculoskeletal: Negative.   Neurological: Positive for dizziness.  Psychiatric/Behavioral: Negative.   All other systems reviewed and are negative.    PHYSICAL EXAM: VS:  BP (!) 150/62 (BP Location: Left Arm, Patient Position: Sitting, Cuff Size: Normal)   Pulse 72   Ht 5' 1"  (1.549 m)   Wt 137 lb (62.1 kg)   BMI 25.89 kg/m  , BMI Body mass index is 25.89 kg/m.   Recent Labs: 04/13/2019: ALT 16; BUN 13; Creat 0.87; Hemoglobin 14.1; Platelets 298; Potassium 4.2; Sodium 137    Lipid Panel Lab Results  Component Value Date   CHOL 253 (H) 04/13/2019   HDL 107 04/13/2019   LDLCALC 128 (H) 04/13/2019   TRIG 79 04/13/2019  now on crestor 5 daily    Wt Readings from Last 3 Encounters:  08/21/19 137 lb (62.1 kg)  07/05/19 140 lb (63.5 kg)  07/04/19 140 lb (63.5 kg)     ASSESSMENT AND PLAN:    ICD-10-CM   1. Aortic atherosclerosis (HCC)  I70.0   2. Ventricular tachycardia (HCC)  I47.2   3. Essential hypertension  I10   4. Mixed hyperlipidemia  E78.2   5. 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 Stressed compliance Recheck lipids in 2-3 months Goal LDL <70  Long discussion with her concerning ZIO monitor MRI echocardiogram stress testing  Total encounter time more than 45 minutes  Greater than 50% was spent in counseling and coordination of care with the patient  Disposition:   F/U  6 months   No orders of the defined types were placed in this encounter.    Signed, Esmond Plants, M.D., Ph.D. 08/21/2019  [place], [place]

## 2019-08-21 ENCOUNTER — Ambulatory Visit (INDEPENDENT_AMBULATORY_CARE_PROVIDER_SITE_OTHER): Payer: Medicare Other | Admitting: Cardiovascular Disease

## 2019-08-21 ENCOUNTER — Encounter: Payer: Self-pay | Admitting: Cardiovascular Disease

## 2019-08-21 ENCOUNTER — Other Ambulatory Visit: Payer: Self-pay

## 2019-08-21 VITALS — BP 150/62 | HR 72 | Ht 61.0 in | Wt 137.0 lb

## 2019-08-21 DIAGNOSIS — E782 Mixed hyperlipidemia: Secondary | ICD-10-CM

## 2019-08-21 DIAGNOSIS — I10 Essential (primary) hypertension: Secondary | ICD-10-CM

## 2019-08-21 DIAGNOSIS — I700 Atherosclerosis of aorta: Secondary | ICD-10-CM | POA: Diagnosis not present

## 2019-08-21 DIAGNOSIS — I6523 Occlusion and stenosis of bilateral carotid arteries: Secondary | ICD-10-CM | POA: Diagnosis not present

## 2019-08-21 DIAGNOSIS — Z8673 Personal history of transient ischemic attack (TIA), and cerebral infarction without residual deficits: Secondary | ICD-10-CM

## 2019-08-21 DIAGNOSIS — I472 Ventricular tachycardia, unspecified: Secondary | ICD-10-CM

## 2019-08-21 NOTE — Patient Instructions (Addendum)
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    Lab work: No new labs needed   If you have labs (blood work) drawn today and your tests are completely normal, you will receive your results only by: Marland Kitchen MyChart Message (if you have MyChart) OR . A paper copy in the mail If you have any lab test that is abnormal or we need to change your treatment, we will call you to review the results.   Testing/Procedures: No new testing needed   Follow-Up: At [facility], you and your health needs are our priority.  As part of our continuing mission to provide you with exceptional heart care, we have created designated Provider Care Teams.  These Care Teams include your primary Cardiologist (physician) and Advanced Practice Providers (APPs -  Physician Assistants and Nurse Practitioners) who all work together to provide you with the care you need, when you need it.  . You will need a follow up appointment in 6 months .   Please call our office 2 months in advance to schedule this appointment.    . Providers on your designated Care Team:   . Murray Hodgkins, NP . Christell Faith, PA-C . Marrianne Mood, PA-C  Any Other Special Instructions Will Be Listed Below (If Applicable).  For educational health videos Log in to : [email or web address] Or : [email or web address], password : [email or web address]

## 2019-08-23 ENCOUNTER — Telehealth: Payer: Self-pay | Admitting: Cardiovascular Disease

## 2019-08-23 DIAGNOSIS — I6523 Occlusion and stenosis of bilateral carotid arteries: Secondary | ICD-10-CM

## 2019-08-23 NOTE — Telephone Encounter (Signed)
I spoke with the patient. She states Dr. Rockey Situ had advised that she needed to see a vascular surgeon. There is a referral in for the [facility] location.  Per the patient, she is requesting to go to [facility] as she lived there for many years and is familiar with that area.  She states she found a doctor- Dr. Tommy Medal and called her office- they advised if Dr. Rockey Situ would refer her and felt this was urgent, they could get her in in the next 1-2 weeks to be seen.  I advised I would need to review with Dr. Rockey Situ to get an ok to refer to Dr. Dorma Russell.  Per the patient, her referral would need to be faxed to [phone].  To Dr. Rockey Situ to review.

## 2019-08-23 NOTE — Telephone Encounter (Signed)
We can place referral to vascular surgery in [place]

## 2019-08-23 NOTE — Telephone Encounter (Signed)
Please call to discuss pt stroke. States she has a Hydrographic surveyor in [place] and would like a referral.

## 2019-08-24 NOTE — Telephone Encounter (Signed)
Pt message sent to make her aware.   Faxed referral to [facility] at [facility], Dr. Tommy Medal. [phone]

## 2019-10-02 ENCOUNTER — Encounter (HOSPITAL_COMMUNITY): Payer: Medicare Other

## 2019-10-02 ENCOUNTER — Encounter: Payer: Medicare Other | Admitting: Vascular Surgery

## 2020-01-01 NOTE — Progress Notes (Signed)
Cardiology Office Note  Date:  01/02/2020   ID:  Melinda Le, DOB [birth year], MRN [id number]  PCP:  Hubbard Hartshorn, FNP   Chief Complaint  Patient presents with  . office visit    Pt is concerned w/ shoulder pain and heart rate increased to (116bpm) randomally/ Pt states still having his of dizziness/ fatigue/ SOB/ chest discomfort/ mild swelling in legs. Meds verbally reviewed w/ pt.     HPI:  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  03/2019 on CT -moderate focal stenosis of the paraclinoid left internal carotid artery -high-grade focal stenosis of the petrous right internal carotid artery stress test, CT attenuation images with mild aortic atherosclerosis, no notable coronary calcification Who presents for follow-up of her dizziness, arrhythmia, carotid disease  On last clinic visit reported having chronic dizziness, worse when she moves her head forward, such as bending her head down  Seen by our office October 2020, referred to vascular surgery Had appointment with vascular surgery October 10, 2019  Also seen by neurologyat [place]: recommended medical management  Currently doing PT, for neck Still with dizziness when bending head forward  Prior lab work reviewed, no updated labs Lab Results  Component Value Date   CHOL 253 (H) 04/13/2019   HDL 107 04/13/2019   [place] 128 (H) 04/13/2019   TRIG 79 04/13/2019   Concerned about elevated heart rate, 116 washing lettuce 100 bpm level with other activities  Blood pressure relatively well controlled 035 up to 009 systolic in the morning down to 120s in the evenings  Prior testing reviewed Stress test 08/10/2019 Pharmacological myocardial perfusion imaging study with  no significant  ischemia Normal wall motion, EF estimated at 83% No EKG changes concerning for ischemia at peak stress or in recovery. 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 2 Ventricular Tachycardia runs occurred, very short, no symptoms 9 Supraventricular Tachycardia /atrial tachycardia runs occurred, very short, no symptoms No atrial fibrillation  EKG personally reviewed by myself on todays visit Shows NSR rate 72 bpm  Prior cervical spine x-ray  Xray: Multilevel cervical spondylosis, worst at C4-5 and C5-6.  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  No MRI was performed; give meclizine and valium.    Meclizine did not help   PMH:   has a past medical history of Atypical chest pain, Crohn's ileitis, without complications ([place]) (01/14/1828), Dizziness, Heart murmur, History of echocardiogram, Hypertension, Melanoma (Melinda Le) (1998), and Stroke (Melinda Le).  PSH:    Past Surgical History:  Procedure Laterality Date  . CARDIAC CATHETERIZATION     Pt doesn't recall date/no stents    Current Outpatient Medications  Medication  Sig Dispense Refill  . amLODipine (NORVASC) 10 MG tablet Take 1 tablet (10 mg total) by mouth daily. 90 tablet 3  . BABY ASPIRIN PO Take by mouth.    . Cholecalciferol (VITAMIN D) 50  MCG (2000 UT) tablet Take 2,000 Units by mouth daily.    . fexofenadine (ALLEGRA) 180 MG tablet Take 180 mg by mouth daily.    Marland Kitchen KRILL OIL PO Take by mouth daily.    . lansoprazole (PREVACID) 15 MG capsule Take 15 mg by mouth daily as needed.    . magnesium gluconate (MAGONATE) 500 MG tablet Take 400 mg by mouth 2 (two) times daily.     . metoprolol succinate (TOPROL-XL) 25 MG 24 hr tablet Take 0.5 tablets (12.5 mg total) by mouth daily. Take with or immediately following a meal. 45 tablet 3  . ondansetron (ZOFRAN ODT) 4 MG disintegrating tablet Take 1 tablet (4 mg total) by mouth every 8 (eight) hours as needed for nausea or vomiting. 30 tablet 1  . rosuvastatin (CRESTOR) 5 MG tablet Take 1 tablet (5 mg total) by mouth daily. 90 tablet 3   No current facility-administered medications for this visit.     Allergies:   Penicillins   Social History:  The patient  reports that she has never smoked. She has never used smokeless tobacco. She reports current alcohol use of about 1.0 standard drinks of alcohol per week. She reports that she does not use drugs.   Family History:   family history includes Heart Problems in her mother.    Review of Systems: Review of Systems  Constitutional: Negative.   Respiratory: Negative.   Cardiovascular: Negative.   Gastrointestinal: Negative.   Musculoskeletal: Negative.   Neurological: Positive for dizziness.  Psychiatric/Behavioral: Negative.   All other systems reviewed and are negative.    PHYSICAL EXAM: VS:  BP (!) 148/62 (BP Location: Left Arm, Patient Position: Sitting, Cuff Size: Normal)   Pulse 77   Ht 5' 1"  (1.549 m)   Wt 137 lb 8 oz (62.4 kg)   SpO2 98%   BMI 25.98 kg/m  , BMI Body mass index is 25.98 kg/m.   Recent Labs: 04/13/2019: ALT 16; BUN 13; Creat 0.87; Hemoglobin 14.1; Platelets 298; Potassium 4.2; Sodium 137    Lipid Panel Lab Results  Component Value Date   CHOL 253 (H) 04/13/2019   HDL 107 04/13/2019   LDLCALC 128  (H) 04/13/2019   TRIG 79 04/13/2019  now on crestor 5 daily, no new numbers available    Wt Readings from Last 3 Encounters:  01/02/20 137 lb 8 oz (62.4 kg)  08/21/19 137 lb (62.1 kg)  07/05/19 140 lb (63.5 kg)    ASSESSMENT AND PLAN:    ICD-10-CM   1. PAD (peripheral artery disease) (HCC)  I73.9 Comp Met (CMET)    Lipid panel    Lipid panel    Comp Met (CMET)  2. Aortic atherosclerosis (HCC)  I70.0 EKG 12-Lead    Comp Met (CMET)    Lipid panel    Lipid panel    Comp Met (CMET)  3. Bilateral carotid artery stenosis  I65.23 Comp Met (CMET)    Lipid panel    Lipid panel    Comp Met (CMET)  4. Ventricular tachycardia (HCC)  I47.2 Comp Met (CMET)    Lipid panel    Lipid panel    Comp Met (CMET)  5. Essential hypertension  I10 Comp Met (CMET)    Lipid panel  Lipid panel    Comp Met (CMET)  6. Mixed hyperlipidemia  E78.2 Comp Met (CMET)    Lipid panel    Lipid panel    Comp Met (CMET)  7. 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  5 mg daily lipdis today ordered Goal LDL less than 70   Total encounter time more than 25 minutes  Greater than 50% was spent in counseling and coordination of care with the patient  Disposition:   F/U  6 months   Orders Placed This Encounter  Procedures  . Comp Met (CMET)  . Lipid panel  . EKG 12-Lead     Signed, Esmond Plants, M.D., Ph.D. 01/02/2020  [place], [place]

## 2020-01-02 ENCOUNTER — Other Ambulatory Visit: Payer: Self-pay

## 2020-01-02 ENCOUNTER — Ambulatory Visit (INDEPENDENT_AMBULATORY_CARE_PROVIDER_SITE_OTHER): Payer: Medicare Other | Admitting: Cardiovascular Disease

## 2020-01-02 ENCOUNTER — Encounter: Payer: Self-pay | Admitting: Cardiovascular Disease

## 2020-01-02 VITALS — BP 148/62 | HR 77 | Ht 61.0 in | Wt 137.5 lb

## 2020-01-02 DIAGNOSIS — I472 Ventricular tachycardia, unspecified: Secondary | ICD-10-CM

## 2020-01-02 DIAGNOSIS — E782 Mixed hyperlipidemia: Secondary | ICD-10-CM

## 2020-01-02 DIAGNOSIS — I739 Peripheral vascular disease, unspecified: Secondary | ICD-10-CM | POA: Diagnosis not present

## 2020-01-02 DIAGNOSIS — I700 Atherosclerosis of aorta: Secondary | ICD-10-CM

## 2020-01-02 DIAGNOSIS — I6523 Occlusion and stenosis of bilateral carotid arteries: Secondary | ICD-10-CM | POA: Diagnosis not present

## 2020-01-02 DIAGNOSIS — Z8673 Personal history of transient ischemic attack (TIA), and cerebral infarction without residual deficits: Secondary | ICD-10-CM

## 2020-01-02 DIAGNOSIS — I10 Essential (primary) hypertension: Secondary | ICD-10-CM

## 2020-01-02 NOTE — Patient Instructions (Addendum)
Medication Instructions:  No changes  If you need a refill on your cardiac medications before your next appointment, please call your pharmacy.    Lab work: CMP and lipids at [facility] , fasting   If you have labs (blood work) drawn today and your tests are completely normal, you will receive your results only by: Marland Kitchen MyChart Message (if you have MyChart) OR . A paper copy in the mail If you have any lab test that is abnormal or we need to change your treatment, we will call you to review the results.   Testing/Procedures: No new testing needed   Follow-Up: At [facility], you and your health needs are our priority.  As part of our continuing mission to provide you with exceptional heart care, we have created designated Provider Care Teams.  These Care Teams include your primary Cardiologist (physician) and Advanced Practice Providers (APPs -  Physician Assistants and Nurse Practitioners) who all work together to provide you with the care you need, when you need it.  . You will need a follow up appointment in 6 months   . Providers on your designated Care Team:   . Murray Hodgkins, NP . Christell Faith, PA-C . Marrianne Mood, PA-C  Any Other Special Instructions Will Be Listed Below (If Applicable).  For educational health videos Log in to : [email or web address] Or : [email or web address], password : [email or web address]

## 2020-01-03 ENCOUNTER — Ambulatory Visit: Payer: Medicare Other

## 2020-01-04 LAB — COMPREHENSIVE METABOLIC PANEL
ALT: 15 IU/L (ref 0–32)
AST: 19 IU/L (ref 0–40)
Albumin/Globulin Ratio: 2.2 (ref 1.2–2.2)
Albumin: 4.6 g/dL (ref 3.7–4.7)
Alkaline Phosphatase: 91 IU/L (ref 39–117)
BUN/Creatinine Ratio: 15 (ref 12–28)
BUN: 14 mg/dL (ref 8–27)
Bilirubin Total: 0.5 mg/dL (ref 0.0–1.2)
CO2: 22 mmol/L (ref 20–29)
Calcium: 9.9 mg/dL (ref 8.7–10.3)
Chloride: 103 mmol/L (ref 96–106)
Creatinine, Ser: 0.94 mg/dL (ref 0.57–1.00)
GFR calc Af Amer: 68 mL/min/{1.73_m2} (ref >59)
GFR calc non Af Amer: 59 mL/min/{1.73_m2} — ABNORMAL LOW (ref >59)
Globulin, Total: 2.1 g/dL (ref 1.5–4.5)
Glucose: 99 mg/dL (ref 65–99)
Potassium: 4.6 mmol/L (ref 3.5–5.2)
Sodium: 142 mmol/L (ref 134–144)
Total Protein: 6.7 g/dL (ref 6.0–8.5)

## 2020-01-04 LAB — LIPID PANEL
Chol/HDL Ratio: 1.7 ratio (ref 0.0–4.4)
Cholesterol, Total: 187 mg/dL (ref 100–199)
HDL: 110 mg/dL (ref >39)
LDL Chol Calc (NIH): 66 mg/dL (ref 0–99)
Triglycerides: 59 mg/dL (ref 0–149)
VLDL Cholesterol Cal: 11 mg/dL (ref 5–40)

## 2020-01-15 ENCOUNTER — Telehealth: Payer: Self-pay | Admitting: *Deleted

## 2020-01-15 MED ORDER — ROSUVASTATIN CALCIUM 10 MG PO TABS: 10.0000 mg | ORAL_TABLET | Freq: Every day | ORAL | 3 refills | Status: DC

## 2020-01-15 MED ORDER — EZETIMIBE 10 MG PO TABS: 10.0000 mg | ORAL_TABLET | Freq: Every day | ORAL | 3 refills | Status: DC

## 2020-01-15 NOTE — Telephone Encounter (Signed)
Reviewed results and recommendations with patient and she was agreeable with plan. Sent in new prescriptions and reviewed both of them with her. She verbalized understanding of our conversation and had no further questions at this time.

## 2020-01-15 NOTE — Telephone Encounter (Signed)
-----   Message from Minna Merritts, MD sent at 01/13/2020 12:26 PM EST ----- Cholesterol running high We will need total cholesterol less than 150 ideally Would recommend we increase Crestor up to 10 mg daily and add Zetia 10 daily That would fully get [place] to goal

## 2020-02-19 ENCOUNTER — Telehealth: Payer: Self-pay | Admitting: Family Medicine

## 2020-02-19 NOTE — Telephone Encounter (Signed)
Left message for patient to schedule Annual Wellness Visit.  Please schedule with Nurse Health Advisor Victoria Britt, RN at [place]  

## 2020-03-15 NOTE — Telephone Encounter (Signed)
Would not decrease crestor, especially if unable to take crestor Significant blockages in the vasculature in the brain that can lead to stroke Goal LDL <70 Last labs were were high

## 2020-03-17 ENCOUNTER — Telehealth: Payer: Self-pay | Admitting: Family Medicine

## 2020-03-17 NOTE — Telephone Encounter (Signed)
Left message for patient to schedule Annual Wellness Visit.  Please schedule with Nurse Health Advisor Clemetine Marker, RN.

## 2020-03-26 NOTE — Telephone Encounter (Signed)
There is aortic atherosclerosis seen on CT scan 2019, I have reviewed the images myself This was also confirmed on stress test imaging Goal LDL in this case is typically less than 70 Current LDL is at goal on statin Certainly her choice whether she would like to stay on a statin or not

## 2020-04-19 IMAGING — CR CHEST - 2 VIEW
1 series · 2 of 2 positions shown · non-contrast
Comparison: Prior CT from 04/29/2016.

CLINICAL DATA: Initial evaluation for acute chest pain.

EXAM:
CHEST - 2 VIEW

[Series 1: dg chest 2 view · 0.14mm/px · 2 of 2 slices shown]
[im 1/2]
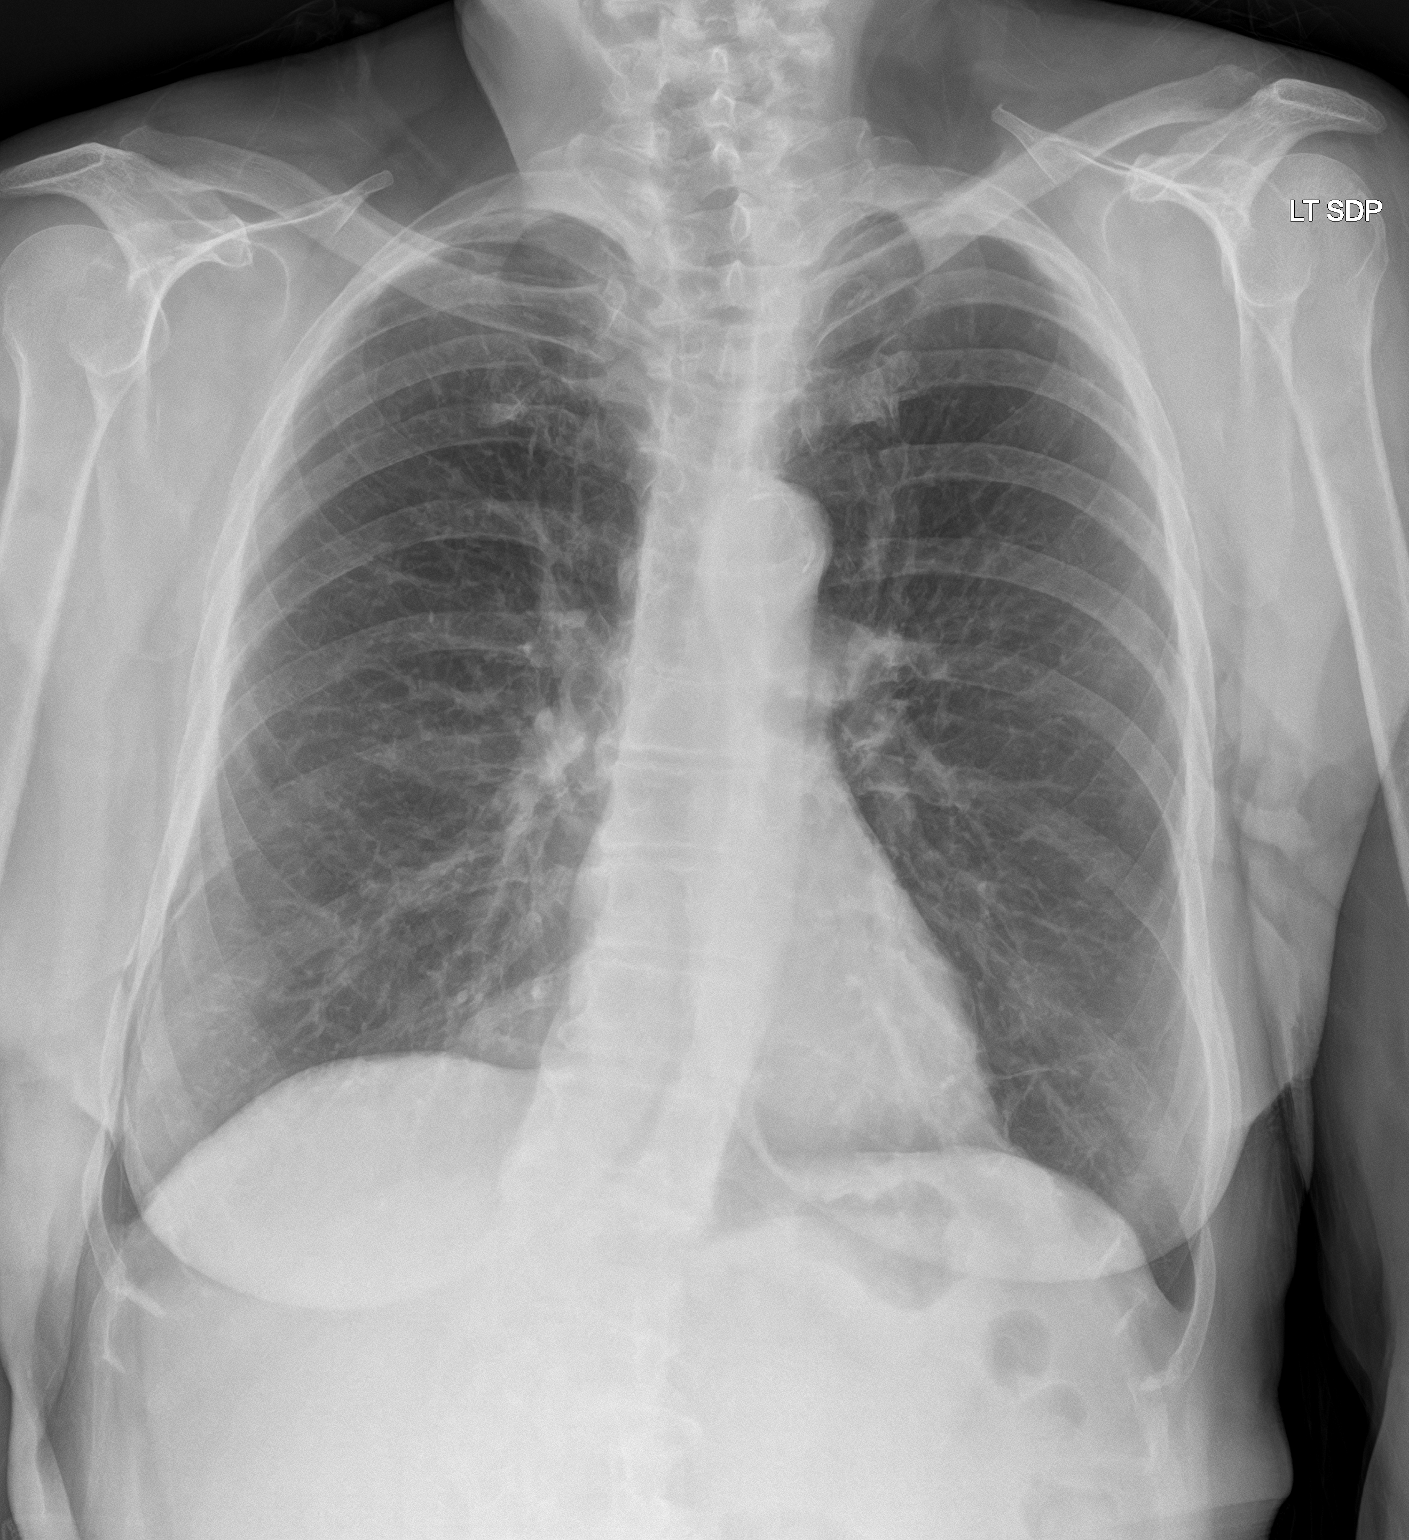
[im 2/2]
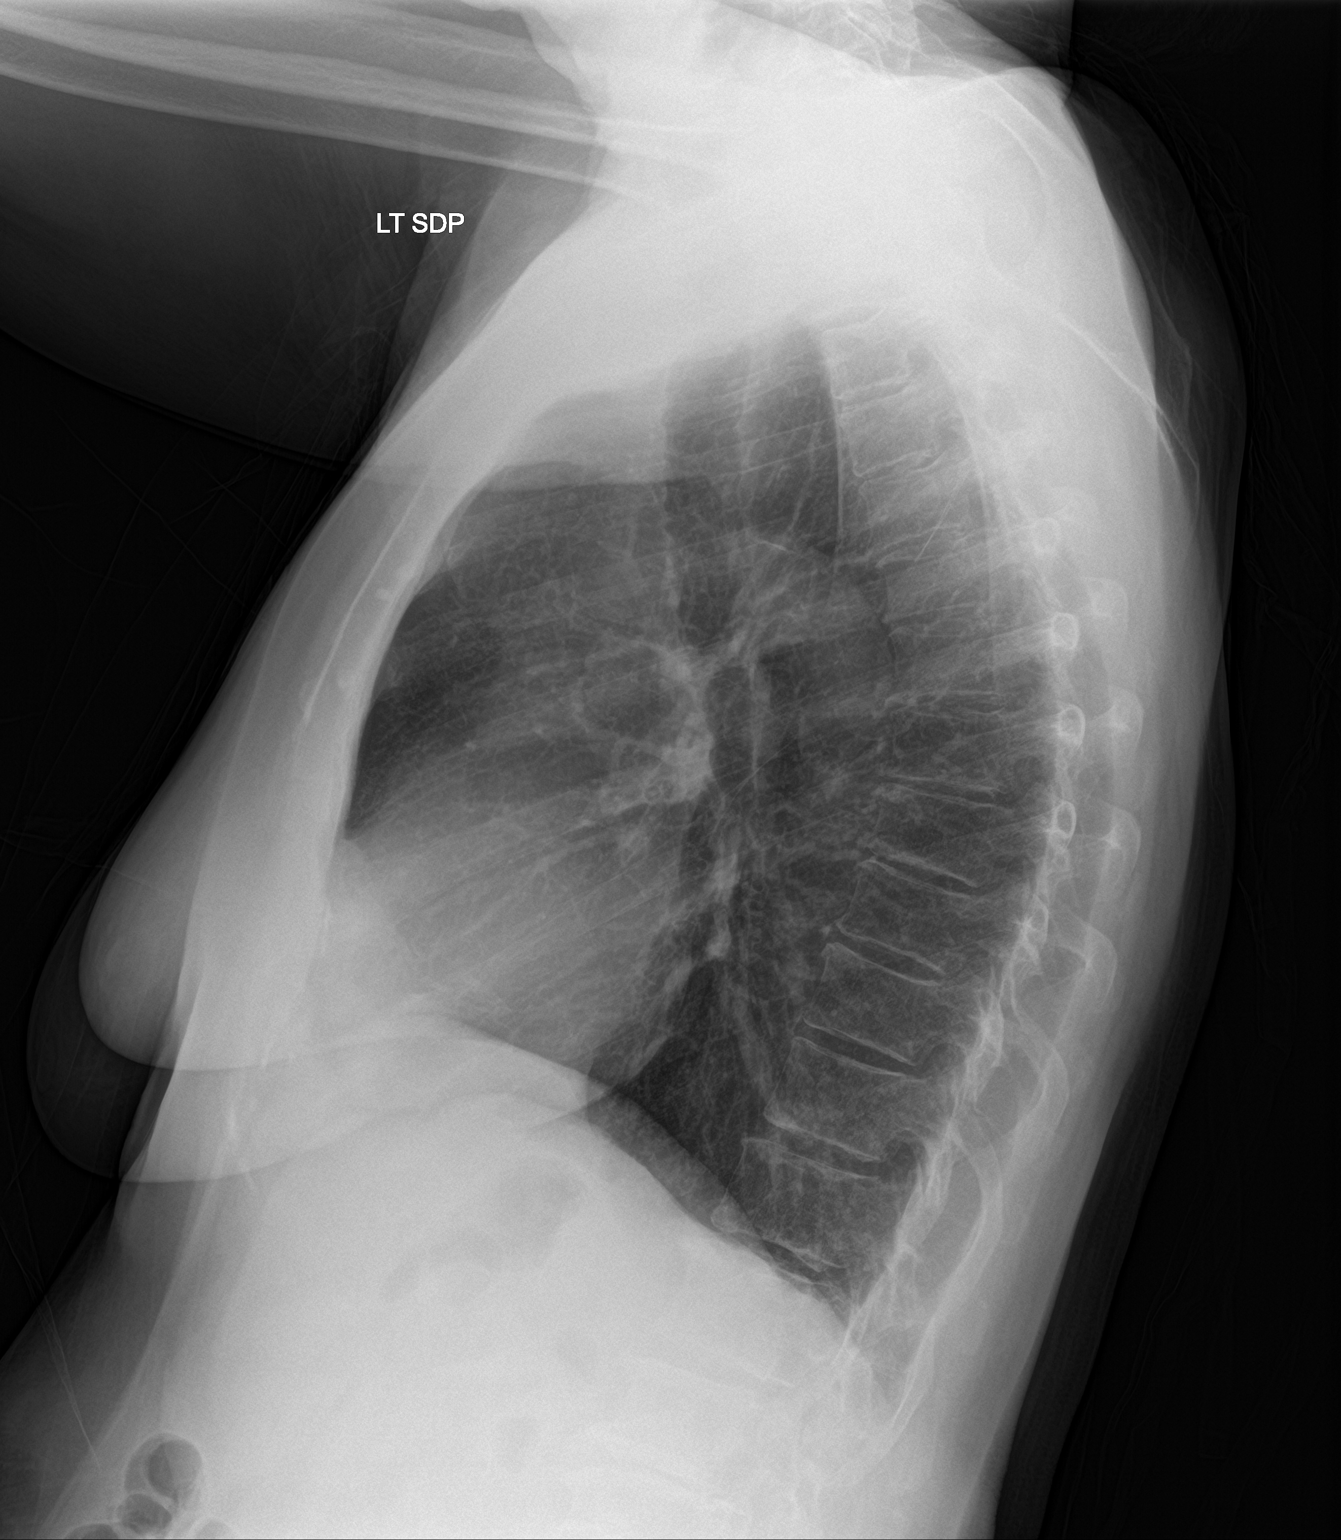

[2 of 2 positions shown; findings below may reference images not displayed]

FINDINGS: The cardiac and mediastinal silhouettes are stable in size and
contour, and remain within normal limits. Aortic atherosclerosis.

The lungs are normally inflated. Mild left basilar atelectasis
and/or scarring. No airspace consolidation, pleural effusion, or
pulmonary edema is identified. There is no pneumothorax.

No acute osseous abnormality identified.
IMPRESSION: 1. Mild left basilar atelectasis and/or scarring. No other active
cardiopulmonary disease.
2. Aortic atherosclerosis.

## 2020-06-10 ENCOUNTER — Other Ambulatory Visit: Payer: Self-pay | Admitting: Internal Medicine

## 2020-06-10 DIAGNOSIS — M5412 Radiculopathy, cervical region: Secondary | ICD-10-CM

## 2020-06-10 DIAGNOSIS — R42 Dizziness and giddiness: Secondary | ICD-10-CM

## 2020-06-10 DIAGNOSIS — Z1231 Encounter for screening mammogram for malignant neoplasm of breast: Secondary | ICD-10-CM

## 2020-06-25 ENCOUNTER — Ambulatory Visit
Admission: RE | Admit: 2020-06-25 | Discharge: 2020-06-25 | Disposition: A | Discharge status: Home / Self Care | Payer: Medicare Other | Source: Ambulatory Visit | Attending: Internal Medicine | Admitting: Internal Medicine

## 2020-06-25 ENCOUNTER — Other Ambulatory Visit: Payer: Self-pay

## 2020-06-25 DIAGNOSIS — M5412 Radiculopathy, cervical region: Secondary | ICD-10-CM | POA: Insufficient documentation

## 2020-06-25 DIAGNOSIS — R42 Dizziness and giddiness: Secondary | ICD-10-CM | POA: Insufficient documentation

## 2020-06-25 MED ORDER — GADOBUTROL 1 MMOL/ML IV SOLN
6.0000 mL | Freq: Once | INTRAVENOUS | Status: AC | PRN
  Administered 2020-06-25: 6 mL via INTRAVENOUS

## 2020-06-30 NOTE — Progress Notes (Signed)
Cardiology Office Note  Date:  07/01/2020   ID:  Melinda Le, DOB 11/05/1941, MRN [id number]  PCP:  Gladstone Lighter, MD   Chief Complaint  Patient presents with  . other    6 month follow up. Meds reviewed by the pt. verbally. "doing well."     HPI:  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  03/2019 on CT PAD, minimal Carotid stenosis -moderate focal stenosis of the paraclinoid left internal carotid artery -high-grade focal stenosis of the petrous right internal carotid artery stress test, CT attenuation images with mild aortic atherosclerosis, no notable coronary calcification Who presents for follow-up of her dizziness, arrhythmia, carotid disease  Last seen in clinic 12/2019 Chronic dizziness, worse bending head over Tried clonazepam, side effects  Had shaking on zetia  BP 110 to 130s, Mainly in the 680S systolic Pulse 81J  On crestor 5 mg 1-2 x a week  Did archive LDL 66 on crestor   Walking daily Does PT therapy for dizziness  CT head at [facility] No significant carotid disease Does not correlate with MRI 2020  Stress test 2020 Pharmacological myocardial perfusion imaging study with no significant  ischemia Normal wall motion, EF estimated at 83% No EKG changes concerning for ischemia at peak stress or in recovery. CT attenuation correction images with mild aortic atherosclerosis, no notable coronary calcification Low risk scan  EKG personally reviewed by myself on todays visit NSR rate 82 bpm no ST or T wave changes   aortic atherosclerosis seen on CT scan 2019 confirmed on stress test imaging  On last clinic visit reported having chronic dizziness, worse when she moves her head forward, such as  bending her head down  Seen by our office October 2020, referred to vascular surgery Had appointment with vascular surgery October 10, 2019  Also seen by neurology at [facility]: recommended medical management  Currently doing PT, for neck Still with dizziness when bending head forward  Prior lab work reviewed, no updated labs Lab Results  Component Value Date   CHOL 187 01/03/2020   HDL 110 01/03/2020   [place] 66 01/03/2020   TRIG 59 01/03/2020    Prior testing reviewed Stress test 08/10/2019 Pharmacological myocardial perfusion imaging study with no significant  ischemia Normal wall motion, EF estimated at 83% No EKG changes concerning for ischemia at peak stress or in recovery. 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  Multiple level DJD on CT scan abdomen pelvis  emergency room Apr 08, 2019 for lightheadedness Woke up with sx, dizzy, legs shaky,  ER visit on 04/08/2019 - EKG normal, CBC and BMP were non-contributory, troponin was negative, chest Xray was non-contributory (does have aortic atherosclerosis), CT head  was negative for acute abnormality.  No MRI was performed; give meclizine and valium.    Meclizine did not help   PMH:   has a past medical history of Atypical chest pain, Crohn's ileitis, without  complications ([place]) (05/13/5464), Dizziness, Heart murmur, History of echocardiogram, Hypertension, Melanoma ([place]) (1998), and Stroke ([place]).  PSH:    Past Surgical History:  Procedure Laterality Date  . CARDIAC CATHETERIZATION     Pt doesn't recall date/no stents    Current Outpatient Medications  Medication Sig Dispense Refill  . amLODipine (NORVASC) 10 MG tablet Take 1 tablet (10 mg total) by mouth daily. 90 tablet 3  . BABY ASPIRIN PO Take by mouth.    . Cholecalciferol (VITAMIN D) 50 MCG (2000 UT) tablet Take 2,000 Units by mouth daily.    . fexofenadine (ALLEGRA) 180 MG tablet Take 180 mg by mouth daily.    Marland Kitchen KRILL OIL PO Take by mouth daily.    . lansoprazole (PREVACID) 15 MG capsule Take 15 mg by mouth daily as needed.    . Magnesium Glycinate 665 MG CAPS Take by mouth in the morning and at bedtime.    . metoprolol succinate (TOPROL-XL) 25 MG 24 hr tablet Take 0.5 tablets (12.5 mg total) by mouth daily. Take with or immediately following a meal. 45 tablet 3  . ondansetron (ZOFRAN ODT) 4 MG disintegrating tablet Take 1 tablet (4 mg total) by mouth every 8 (eight) hours as needed for nausea or vomiting. 30 tablet 1  . rosuvastatin (CRESTOR) 10 MG tablet Take 1 tablet (10 mg total) by mouth daily. (Patient taking differently: Take 10 mg by mouth 2 (two) times a week. ) 90 tablet 3   No current facility-administered medications for this visit.     Allergies:   Penicillins   Social History:  The patient  reports that she has never smoked. She has never used smokeless tobacco. She reports current alcohol use of about 1.0 standard drink of alcohol per week. She reports that she does not use drugs.   Family History:   family history includes Heart Problems in her mother.    Review of Systems: Review of Systems  Constitutional: Negative.   Respiratory: Negative.   Cardiovascular: Negative.   Gastrointestinal: Negative.   Musculoskeletal: Negative.   Neurological: Positive for  dizziness.  Psychiatric/Behavioral: Negative.   All other systems reviewed and are negative.    PHYSICAL EXAM: VS:  BP (!) 150/60 (BP Location: Left Arm, Patient Position: Sitting, Cuff Size: Normal)   Pulse 82   Ht 5' 2"  (1.575 m)   Wt 132 lb 8 oz (60.1 kg)   SpO2 98%   BMI 24.23 kg/m  , BMI Body mass index is 24.23 kg/m.  Constitutional:  oriented to person, place, and time. No distress.  HENT:  Head: Grossly normal Eyes:  no discharge. No scleral icterus.  Neck: No JVD, no carotid bruits  Cardiovascular: Regular rate and rhythm, no murmurs appreciated Pulmonary/Chest: Clear to auscultation bilaterally, no wheezes or rails Abdominal: Soft.  no distension.  no tenderness.  Musculoskeletal: Normal range of motion Neurological:  normal muscle tone. Coordination normal. No atrophy Skin: Skin warm and dry Psychiatric: normal affect, pleasant  Recent Labs: 01/03/2020: ALT 15; BUN 14; Creatinine, Ser 0.94; Potassium 4.6; Sodium 142    Lipid Panel Lab Results  Component Value Date   CHOL 187 01/03/2020   HDL 110 01/03/2020   LDLCALC 66 01/03/2020   TRIG 59 01/03/2020  now on crestor  5 daily, no new numbers available    Wt Readings from Last 3 Encounters:  07/01/20 132 lb 8 oz (60.1 kg)  01/02/20 137 lb 8 oz (62.4 kg)  08/21/19 137 lb (62.1 kg)    ASSESSMENT AND PLAN:    ICD-10-CM   1. PAD (peripheral artery disease) (HCC)  I73.9   2. Aortic atherosclerosis (HCC)  I70.0   3. Bilateral carotid artery stenosis  I65.23   4. Ventricular tachycardia (HCC)  I47.2 EKG 12-Lead  5. Essential hypertension  I10   6. Mixed hyperlipidemia  E78.2   7. 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   Total encounter time more than 25 minutes  Greater than 50% was spent in counseling and coordination of care with the patient   Orders Placed This Encounter  Procedures  . EKG 12-Lead     Signed, Esmond Plants, M.D., Ph.D. 07/01/2020  [place], [place]

## 2020-07-01 ENCOUNTER — Encounter: Payer: Self-pay | Admitting: Cardiovascular Disease

## 2020-07-01 ENCOUNTER — Other Ambulatory Visit: Payer: Self-pay

## 2020-07-01 ENCOUNTER — Ambulatory Visit (INDEPENDENT_AMBULATORY_CARE_PROVIDER_SITE_OTHER): Payer: Medicare Other | Admitting: Cardiovascular Disease

## 2020-07-01 VITALS — BP 150/60 | HR 82 | Ht 62.0 in | Wt 132.5 lb

## 2020-07-01 DIAGNOSIS — I700 Atherosclerosis of aorta: Secondary | ICD-10-CM | POA: Diagnosis not present

## 2020-07-01 DIAGNOSIS — I10 Essential (primary) hypertension: Secondary | ICD-10-CM

## 2020-07-01 DIAGNOSIS — Z8673 Personal history of transient ischemic attack (TIA), and cerebral infarction without residual deficits: Secondary | ICD-10-CM

## 2020-07-01 DIAGNOSIS — I6523 Occlusion and stenosis of bilateral carotid arteries: Secondary | ICD-10-CM

## 2020-07-01 DIAGNOSIS — E782 Mixed hyperlipidemia: Secondary | ICD-10-CM

## 2020-07-01 DIAGNOSIS — I472 Ventricular tachycardia, unspecified: Secondary | ICD-10-CM

## 2020-07-01 DIAGNOSIS — I739 Peripheral vascular disease, unspecified: Secondary | ICD-10-CM

## 2020-07-01 MED ORDER — METOPROLOL SUCCINATE ER 25 MG PO TB24: 12.5000 mg | ORAL_TABLET | Freq: Every day | ORAL | 3 refills | Status: DC

## 2020-07-01 MED ORDER — ROSUVASTATIN CALCIUM 5 MG PO TABS: 5.0000 mg | ORAL_TABLET | Freq: Every day | ORAL | 3 refills | Status: DC

## 2020-07-01 MED ORDER — AMLODIPINE BESYLATE 10 MG PO TABS: 10.0000 mg | ORAL_TABLET | Freq: Every day | ORAL | 3 refills | Status: DC

## 2020-07-01 NOTE — Patient Instructions (Addendum)
Medication Instructions:  No changes  If you need a refill on your cardiac medications before your next appointment, please call your pharmacy.    Lab work: No new labs needed   If you have labs (blood work) drawn today and your tests are completely normal, you will receive your results only by: . MyChart Message (if you have MyChart) OR . A paper copy in the mail If you have any lab test that is abnormal or we need to change your treatment, we will call you to review the results.   Testing/Procedures: No new testing needed   Follow-Up: At [facility], you and your health needs are our priority.  As part of our continuing mission to provide you with exceptional heart care, we have created designated Provider Care Teams.  These Care Teams include your primary Cardiologist (physician) and Advanced Practice Providers (APPs -  Physician Assistants and Nurse Practitioners) who all work together to provide you with the care you need, when you need it.  . You will need a follow up appointment in 12 months  . Providers on your designated Care Team:   . Christopher Berge, NP . Ryan Dunn, PA-C . Jacquelyn Visser, PA-C  Any Other Special Instructions Will Be Listed Below (If Applicable).  COVID-19 Vaccine Information can be found at: [email or web address] For questions related to vaccine distribution or appointments, please email [email or web address] or call [phone].     

## 2020-07-02 IMAGING — MG DIGITAL SCREENING BILATERAL MAMMOGRAM WITH TOMO AND CAD
8 series · 9 of 24 positions shown · non-contrast
Comparison: Previous exam(s).

CLINICAL DATA: Screening.

EXAM:
DIGITAL SCREENING BILATERAL MAMMOGRAM WITH TOMO AND CAD

[L MLO synth-2D]
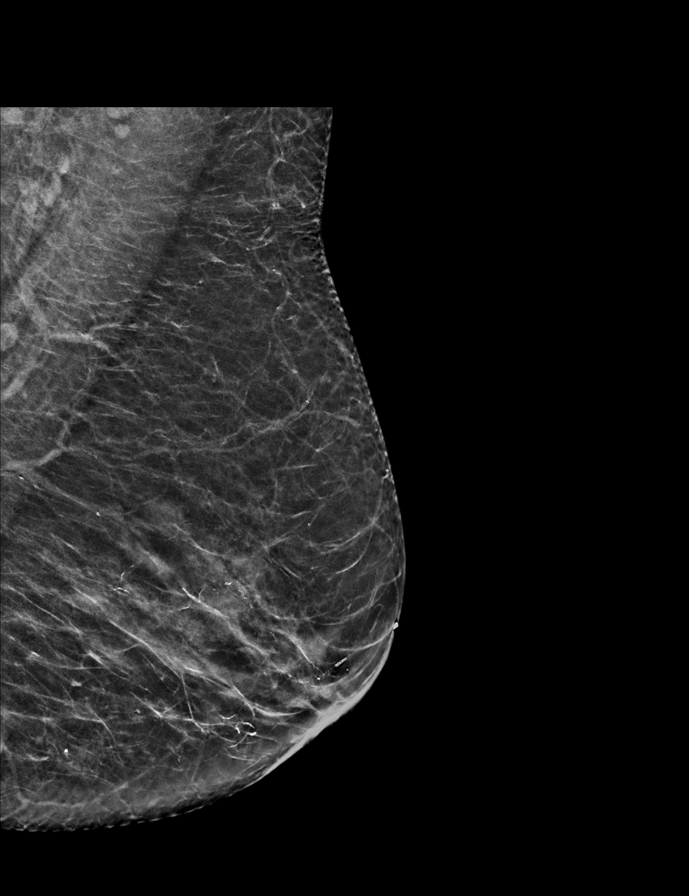

[L CC synth-2D]
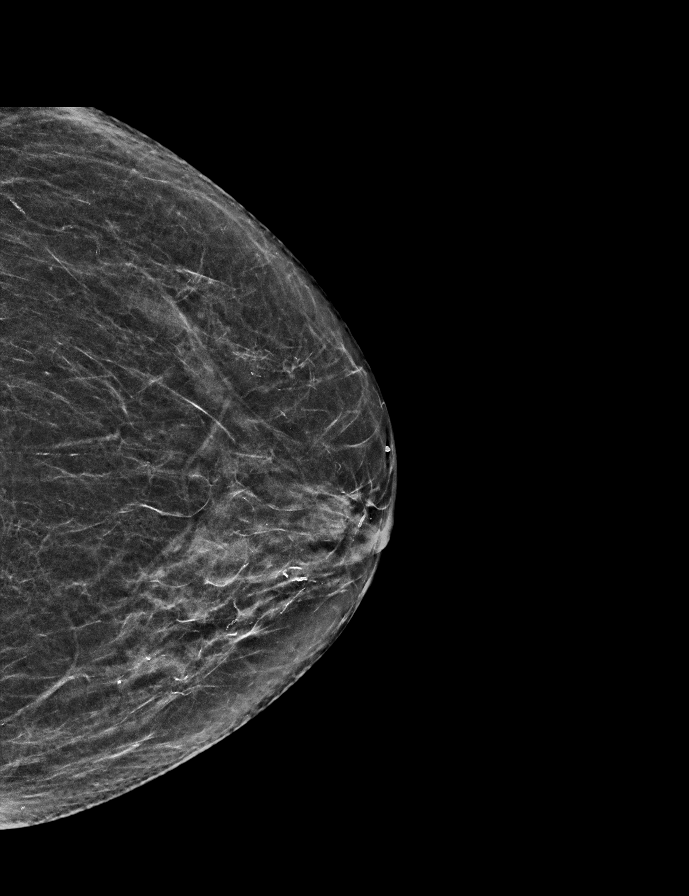

[R MLO synth-2D]
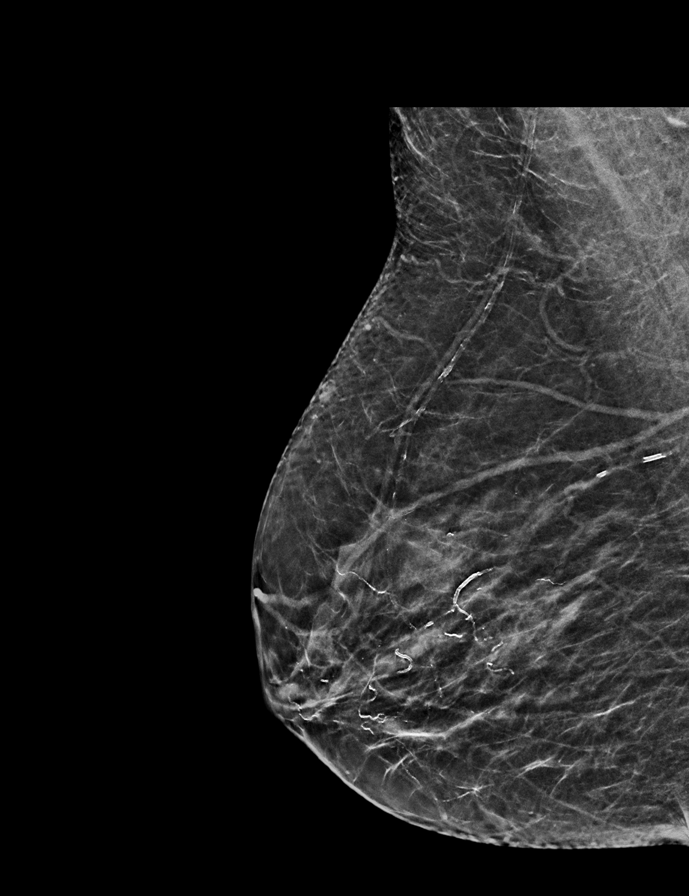

[R CC synth-2D]
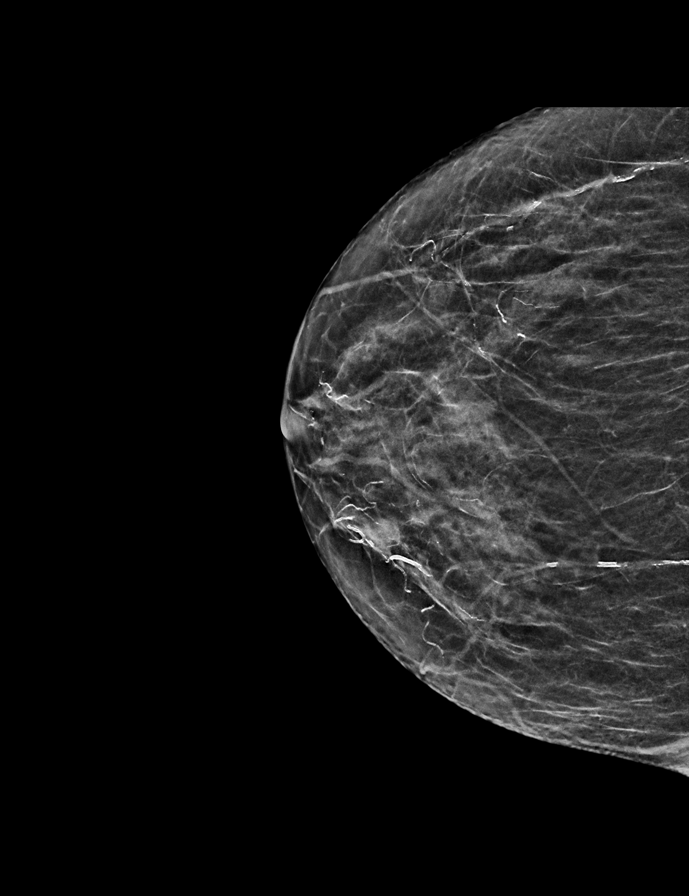

[R CC tomo · 2 of 44 frames shown]
[frame 15/44]
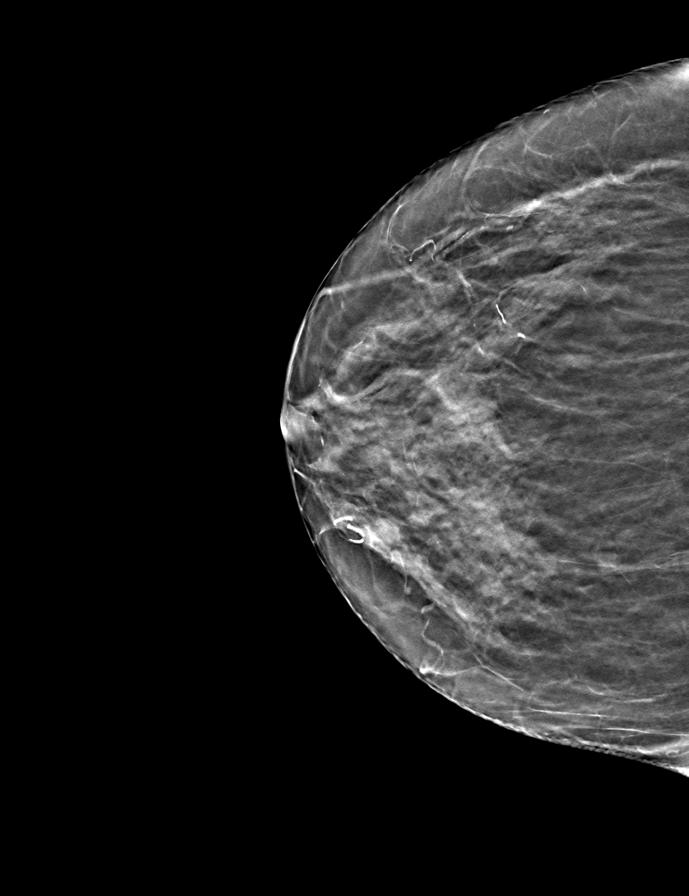
[frame 23/44]
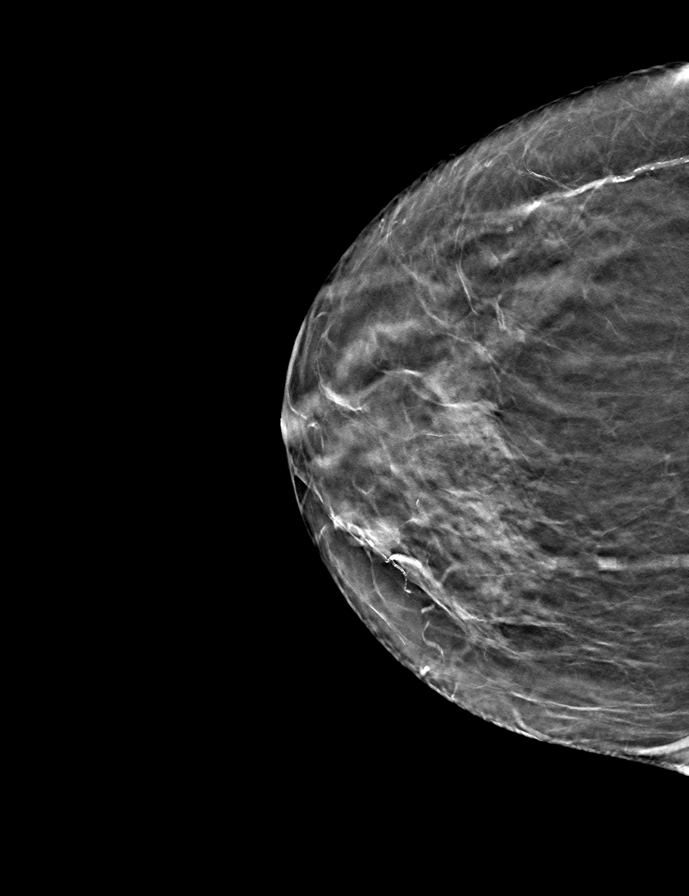

[R MLO tomo · tomo slice 25/49.0]
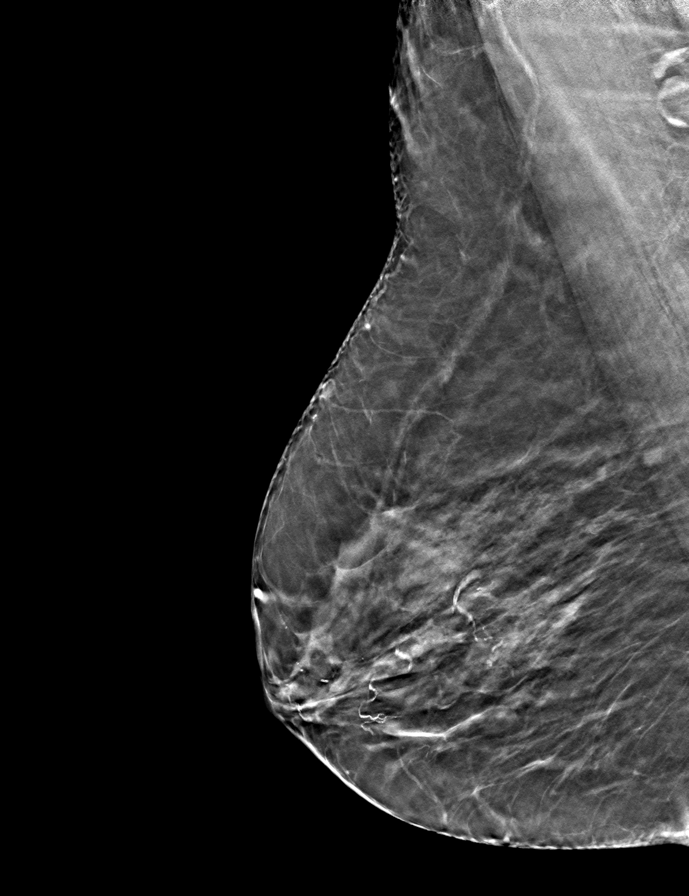

[L CC tomo · tomo slice 25/48.0]
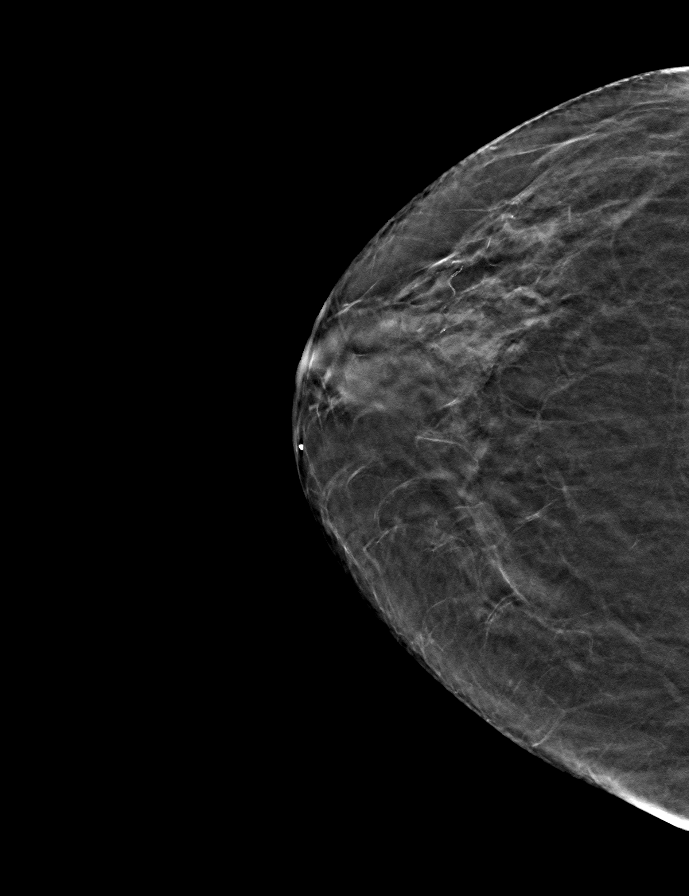

[L MLO tomo · tomo slice 27/52.0]
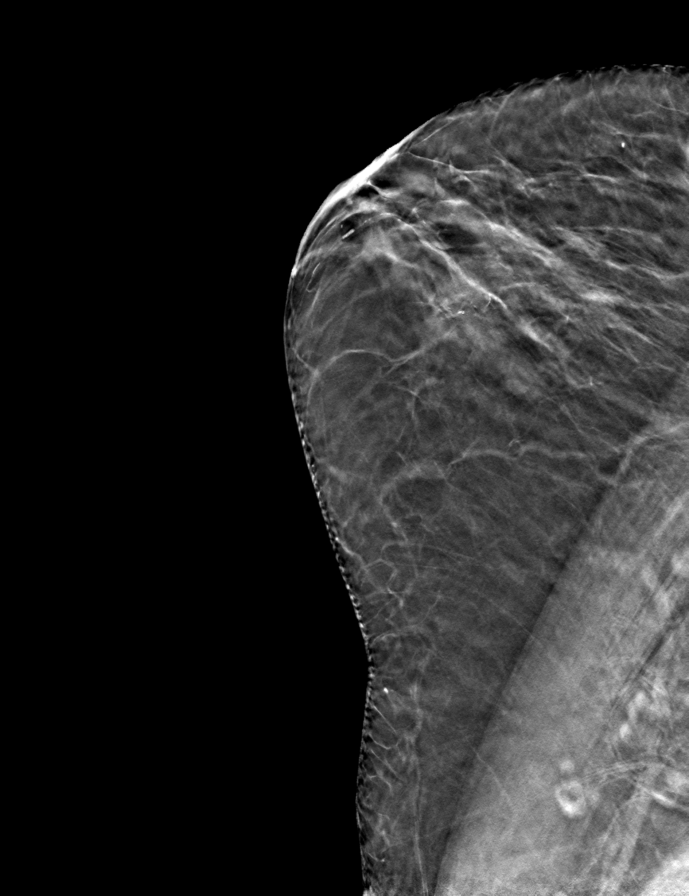

[9 of 24 positions shown; findings below may reference images not displayed]

ACR Breast Density Category c: The breast tissue is heterogeneously
dense, which may obscure small masses.
FINDINGS: There are no findings suspicious for malignancy. Images were
processed with CAD.
IMPRESSION: No mammographic evidence of malignancy. A result letter of this
screening mammogram will be mailed directly to the patient.

RECOMMENDATION:
Screening mammogram in one year. (Code:[id number])

BI-RADS CATEGORY  1: Negative.

## 2020-08-20 DIAGNOSIS — R42 Dizziness and giddiness: Secondary | ICD-10-CM | POA: Insufficient documentation

## 2020-09-10 ENCOUNTER — Other Ambulatory Visit: Payer: Self-pay

## 2020-09-10 ENCOUNTER — Ambulatory Visit
Admission: RE | Admit: 2020-09-10 | Discharge: 2020-09-10 | Disposition: A | Discharge status: Home / Self Care | Payer: Medicare Other | Source: Ambulatory Visit | Attending: Internal Medicine | Admitting: Internal Medicine

## 2020-09-10 DIAGNOSIS — Z1231 Encounter for screening mammogram for malignant neoplasm of breast: Secondary | ICD-10-CM | POA: Diagnosis present

## 2021-06-16 ENCOUNTER — Other Ambulatory Visit: Payer: Self-pay | Admitting: Internal Medicine

## 2021-06-16 DIAGNOSIS — Z1231 Encounter for screening mammogram for malignant neoplasm of breast: Secondary | ICD-10-CM

## 2021-07-07 ENCOUNTER — Other Ambulatory Visit: Payer: Self-pay | Admitting: Cardiovascular Disease

## 2021-07-07 NOTE — Telephone Encounter (Signed)
Please contact patient for appointment. She is overdue for follow up and has not been seen since August 2021. Thank you

## 2021-07-14 ENCOUNTER — Other Ambulatory Visit: Payer: Self-pay

## 2021-07-14 MED ORDER — METOPROLOL SUCCINATE ER 25 MG PO TB24: 12.5000 mg | ORAL_TABLET | Freq: Every day | ORAL | 0 refills | Status: DC

## 2021-07-15 ENCOUNTER — Other Ambulatory Visit: Payer: Self-pay

## 2021-07-15 ENCOUNTER — Encounter: Payer: Self-pay | Admitting: Cardiovascular Disease

## 2021-07-15 ENCOUNTER — Ambulatory Visit (INDEPENDENT_AMBULATORY_CARE_PROVIDER_SITE_OTHER): Payer: Medicare Other | Admitting: Cardiovascular Disease

## 2021-07-15 VITALS — BP 160/70 | HR 77 | Ht 62.0 in | Wt 144.0 lb

## 2021-07-15 DIAGNOSIS — I472 Ventricular tachycardia, unspecified: Secondary | ICD-10-CM

## 2021-07-15 DIAGNOSIS — I6523 Occlusion and stenosis of bilateral carotid arteries: Secondary | ICD-10-CM | POA: Diagnosis not present

## 2021-07-15 DIAGNOSIS — I10 Essential (primary) hypertension: Secondary | ICD-10-CM

## 2021-07-15 DIAGNOSIS — I739 Peripheral vascular disease, unspecified: Secondary | ICD-10-CM | POA: Diagnosis not present

## 2021-07-15 DIAGNOSIS — E782 Mixed hyperlipidemia: Secondary | ICD-10-CM

## 2021-07-15 DIAGNOSIS — I700 Atherosclerosis of aorta: Secondary | ICD-10-CM

## 2021-07-15 DIAGNOSIS — Z8673 Personal history of transient ischemic attack (TIA), and cerebral infarction without residual deficits: Secondary | ICD-10-CM

## 2021-07-15 MED ORDER — METOPROLOL SUCCINATE ER 25 MG PO TB24: 12.5000 mg | ORAL_TABLET | Freq: Every day | ORAL | 3 refills | Status: DC

## 2021-07-15 MED ORDER — ROSUVASTATIN CALCIUM 5 MG PO TABS: 5.0000 mg | ORAL_TABLET | Freq: Every day | ORAL | 3 refills | Status: DC

## 2021-07-15 MED ORDER — AMLODIPINE BESYLATE 10 MG PO TABS: 10.0000 mg | ORAL_TABLET | Freq: Every day | ORAL | 3 refills | Status: DC

## 2021-07-15 NOTE — Patient Instructions (Addendum)
Medication Instructions:  No changes  If you need a refill on your cardiac medications before your next appointment, please call your pharmacy.   Lab work: No new labs needed  Testing/Procedures: No new testing needed  Follow-Up: At [facility], you and your health needs are our priority.  As part of our continuing mission to provide you with exceptional heart care, we have created designated Provider Care Teams.  These Care Teams include your primary Cardiologist (physician) and Advanced Practice Providers (APPs -  Physician Assistants and Nurse Practitioners) who all work together to provide you with the care you need, when you need it.  You will need a follow up appointment in 12 months  Providers on your designated Care Team:   Murray Hodgkins, NP Christell Faith, PA-C Marrianne Mood, PA-C Cadence N[place], [place]  COVID-19 Vaccine Information can be found at: [email or web address].uk For questions related to vaccine distribution or appointments, please email vaccine@ [email or web address] or call [phone].

## 2021-07-15 NOTE — Progress Notes (Signed)
Cardiology Office Note  Date:  07/15/2021   ID:  Melinda Le, DOB [birth year], MRN [id number]  PCP:  Gladstone Lighter, MD   Chief Complaint  Patient presents with   12 month follow up     "Doing well." Medications reviewed by the patient verbally.      HPI:  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 No acute intracranial large vessel occlusion or high grade stenosis.  2. Previously described stenosis of the bilateral ICAs is not seen on this  exam and is likely artifactual in prior MRI.  Who presents for follow-up of her dizziness, arrhythmia  Last seen in clinic 06/2020 Doing well,  Walks daily  BP at home 048 to 889 systolic  V6X 5.8 Total chol 236, LDL 111 On crestor 5 mg 1-2 x a week Had shaking on zetia  CT neck 2021 Cervical Spine: Moderate multilevel degenerative changes without severe  spinal canal stenosis.   Chronic dizziness, worse bending head over Tried clonazepam, side effects Sx better  Walking daily Does PT therapy for dizziness   EKG personally reviewed by myself on todays visit NSR rate 77bpm no ST or T wave changes  CT head at [facility] No significant carotid disease Does not correlate with MRI 2020  Stress test 2020 Pharmacological myocardial perfusion imaging study with no significant  ischemia Normal wall motion, EF estimated at 83% No EKG changes concerning for ischemia at peak stress or in recovery. CT attenuation correction images with mild aortic atherosclerosis, no notable coronary calcification Low risk scan  aortic atherosclerosis seen on CT scan 2019 confirmed on stress test imaging  On last clinic visit reported having chronic dizziness, worse  when she moves her head forward, such as bending her head down  Seen by our office October 2020, referred to vascular surgery Had appointment with vascular surgery October 10, 2019  Also seen by neurology at [facility]: recommended medical management  Prior testing reviewed Stress test 08/10/2019 Pharmacological myocardial perfusion imaging study with no significant  ischemia Normal wall motion, EF estimated at 83% No EKG changes concerning for ischemia at peak stress or in recovery. 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  No MRI was performed; give meclizine and valium.    Meclizine did not help   PMH:   has a past  medical history of Atypical chest pain, Crohn's ileitis, without complications ([place]) (03/09/7781), Dizziness, Heart murmur, History of echocardiogram, Hypertension, Melanoma ([place]) (1998), and Stroke ([place]).  PSH:    Past Surgical History:  Procedure Laterality Date   CARDIAC  CATHETERIZATION     Pt doesn't recall date/no stents    Current Outpatient Medications  Medication Sig Dispense Refill   amLODipine (NORVASC) 10 MG tablet Take 1 tablet (10 mg total) by mouth daily. 90 tablet 3   BABY ASPIRIN PO Take by mouth.     Cholecalciferol (VITAMIN D) 50 MCG (2000 UT) tablet Take 2,000 Units by mouth daily.     fexofenadine (ALLEGRA) 180 MG tablet Take 180 mg by mouth daily.     folic acid (FOLVITE) 1 MG tablet Take by mouth.     KRILL OIL PO Take by mouth daily.     lansoprazole (PREVACID) 15 MG capsule Take 15 mg by mouth daily as needed.     Magnesium Glycinate 665 MG CAPS Take by mouth in the morning and at bedtime.     metoprolol succinate (TOPROL-XL) 25 MG 24 hr tablet Take 0.5 tablets (12.5 mg total) by mouth daily. Take with or immediately following a meal. 45 tablet 0   ondansetron (ZOFRAN ODT) 4 MG disintegrating tablet Take 1 tablet (4 mg total) by mouth every 8 (eight) hours as needed for nausea or vomiting. 30 tablet 1   rosuvastatin (CRESTOR) 5 MG tablet Take 1 tablet (5 mg total) by mouth daily. 90 tablet 3   No current facility-administered medications for this visit.     Allergies:   Penicillins   Social History:  The patient  reports that she has never smoked. She has never used smokeless tobacco. She reports current alcohol use of about 1.0 standard drink per week. She reports that she does not use drugs.   Family History:   family history includes Heart Problems in her mother.    Review of Systems: Review of Systems  Constitutional: Negative.   Respiratory: Negative.    Cardiovascular: Negative.   Gastrointestinal: Negative.   Musculoskeletal: Negative.   Neurological:  Positive for dizziness.  Psychiatric/Behavioral: Negative.    All other systems reviewed and are negative.   PHYSICAL EXAM: VS:  BP (!) 160/70 (BP Location: Left Arm, Patient Position: Sitting, Cuff Size: Normal)   Pulse 77   Ht 5' 2"  (1.575 m)   Wt 144 lb (65.3  kg)   SpO2 98%   BMI 26.34 kg/m  , BMI Body mass index is 26.34 kg/m. Constitutional:  oriented to person, place, and time. No distress.  HENT:  Head: Grossly normal Eyes:  no discharge. No scleral icterus.  Neck: No JVD, no carotid bruits  Cardiovascular: Regular rate and rhythm, no murmurs appreciated Pulmonary/Chest: Clear to auscultation bilaterally, no wheezes or rails Abdominal: Soft.  no distension.  no tenderness.  Musculoskeletal: Normal range of motion Neurological:  normal muscle tone. Coordination normal. No atrophy Skin: Skin warm and dry Psychiatric: normal affect, pleasant  Recent Labs: No results found for requested labs within last 8760 hours.    Lipid Panel Lab Results  Component Value Date   CHOL 187 01/03/2020   HDL 110 01/03/2020   LDLCALC 66 01/03/2020   TRIG 59 01/03/2020  now on crestor 5 daily, no new numbers available    Wt Readings from Last 3 Encounters:  07/15/21 144 lb (65.3 kg)  07/01/20 132 lb 8 oz (60.1 kg)  01/02/20 137 lb 8 oz (  62.4 kg)    ASSESSMENT AND PLAN:    ICD-10-CM   1. PAD (peripheral artery disease) (HCC)  I73.9     2. Aortic atherosclerosis (HCC)  I70.0     3. Bilateral carotid artery stenosis  I65.23     4. Ventricular tachycardia (HCC)  I47.2     5. Essential hypertension  I10     6. Mixed hyperlipidemia  E78.2     7. History of stroke  Z86.73     8. 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    Signed, Esmond Plants, M.D., Ph.D. 07/15/2021  [facility] [place], [place]

## 2021-09-11 ENCOUNTER — Other Ambulatory Visit: Payer: Self-pay

## 2021-09-11 ENCOUNTER — Ambulatory Visit
Admission: RE | Admit: 2021-09-11 | Discharge: 2021-09-11 | Disposition: A | Discharge status: Home / Self Care | Payer: Medicare Other | Source: Ambulatory Visit | Attending: Internal Medicine | Admitting: Internal Medicine

## 2021-09-11 DIAGNOSIS — Z1231 Encounter for screening mammogram for malignant neoplasm of breast: Secondary | ICD-10-CM | POA: Diagnosis not present

## 2021-12-16 DIAGNOSIS — Z961 Presence of intraocular lens: Secondary | ICD-10-CM | POA: Insufficient documentation

## 2021-12-16 DIAGNOSIS — H04123 Dry eye syndrome of bilateral lacrimal glands: Secondary | ICD-10-CM | POA: Insufficient documentation

## 2021-12-16 DIAGNOSIS — H2511 Age-related nuclear cataract, right eye: Secondary | ICD-10-CM | POA: Insufficient documentation

## 2021-12-16 DIAGNOSIS — H43393 Other vitreous opacities, bilateral: Secondary | ICD-10-CM | POA: Insufficient documentation

## 2022-02-15 NOTE — Telephone Encounter (Signed)
Unable to close old encounter  ?

## 2022-02-15 NOTE — Telephone Encounter (Signed)
Please see note below. 

## 2022-02-16 ENCOUNTER — Other Ambulatory Visit: Payer: Self-pay

## 2022-05-18 DIAGNOSIS — H0288A Meibomian gland dysfunction right eye, upper and lower eyelids: Secondary | ICD-10-CM | POA: Insufficient documentation

## 2022-06-04 ENCOUNTER — Emergency Department
Admission: EM | Admit: 2022-06-04 | Discharge: 2022-06-05 | Disposition: A | Discharge status: Home / Self Care | Payer: Medicare Other | Attending: Emergency Medicine | Admitting: Emergency Medicine

## 2022-06-04 ENCOUNTER — Emergency Department: Payer: Medicare Other

## 2022-06-04 ENCOUNTER — Other Ambulatory Visit
Admission: RE | Admit: 2022-06-04 | Discharge: 2022-06-04 | Disposition: A | Discharge status: Home / Self Care | Payer: Medicare Other | Source: Ambulatory Visit | Attending: Internal Medicine | Admitting: Internal Medicine

## 2022-06-04 ENCOUNTER — Other Ambulatory Visit: Payer: Self-pay

## 2022-06-04 ENCOUNTER — Encounter: Payer: Self-pay | Admitting: Internal Medicine

## 2022-06-04 ENCOUNTER — Encounter: Payer: Self-pay | Admitting: Emergency Medicine

## 2022-06-04 ENCOUNTER — Ambulatory Visit (INDEPENDENT_AMBULATORY_CARE_PROVIDER_SITE_OTHER): Payer: Medicare Other | Admitting: Internal Medicine

## 2022-06-04 ENCOUNTER — Telehealth: Payer: Self-pay | Admitting: Cardiovascular Disease

## 2022-06-04 ENCOUNTER — Telehealth: Payer: Self-pay | Admitting: Internal Medicine

## 2022-06-04 VITALS — BP 183/81 | HR 85 | Ht 61.0 in | Wt 145.0 lb

## 2022-06-04 DIAGNOSIS — R000 Tachycardia, unspecified: Secondary | ICD-10-CM | POA: Insufficient documentation

## 2022-06-04 DIAGNOSIS — R0602 Shortness of breath: Secondary | ICD-10-CM

## 2022-06-04 DIAGNOSIS — R0789 Other chest pain: Secondary | ICD-10-CM

## 2022-06-04 DIAGNOSIS — R42 Dizziness and giddiness: Secondary | ICD-10-CM

## 2022-06-04 DIAGNOSIS — Z79899 Other long term (current) drug therapy: Secondary | ICD-10-CM | POA: Insufficient documentation

## 2022-06-04 DIAGNOSIS — I10 Essential (primary) hypertension: Secondary | ICD-10-CM

## 2022-06-04 DIAGNOSIS — N390 Urinary tract infection, site not specified: Secondary | ICD-10-CM

## 2022-06-04 LAB — CBC WITH DIFFERENTIAL/PLATELET
Abs Immature Granulocytes: 0.02 10*3/uL (ref 0.00–0.07)
Basophils Absolute: 0 10*3/uL (ref 0.0–0.1)
Basophils Relative: 0 %
Eosinophils Absolute: 0.2 10*3/uL (ref 0.0–0.5)
Eosinophils Relative: 1 %
HCT: 38.2 % (ref 36.0–46.0)
Hemoglobin: 13.2 g/dL (ref 12.0–15.0)
Immature Granulocytes: 0 %
Lymphocytes Relative: 22 %
Lymphs Abs: 2.3 10*3/uL (ref 0.7–4.0)
MCH: 31.8 pg (ref 26.0–34.0)
MCHC: 34.6 g/dL (ref 30.0–36.0)
MCV: 92 fL (ref 80.0–100.0)
Monocytes Absolute: 0.7 10*3/uL (ref 0.1–1.0)
Monocytes Relative: 7 %
Neutro Abs: 7.3 10*3/uL (ref 1.7–7.7)
Neutrophils Relative %: 70 %
Platelets: 298 10*3/uL (ref 150–400)
RBC: 4.15 MIL/uL (ref 3.87–5.11)
RDW: 11.9 % (ref 11.5–15.5)
WBC: 10.5 10*3/uL (ref 4.0–10.5)
nRBC: 0 % (ref 0.0–0.2)

## 2022-06-04 LAB — BRAIN NATRIURETIC PEPTIDE
B Natriuretic Peptide: 187.9 pg/mL — ABNORMAL HIGH (ref 0.0–100.0)
B Natriuretic Peptide: 141.2 pg/mL — ABNORMAL HIGH (ref 0.0–100.0)

## 2022-06-04 LAB — BASIC METABOLIC PANEL
Anion gap: 10 (ref 5–15)
Anion gap: 8 (ref 5–15)
BUN: 14 mg/dL (ref 8–23)
BUN: 14 mg/dL (ref 8–23)
CO2: 24 mmol/L (ref 22–32)
CO2: 25 mmol/L (ref 22–32)
Calcium: 9.8 mg/dL (ref 8.9–10.3)
Calcium: 9.6 mg/dL (ref 8.9–10.3)
Chloride: 101 mmol/L (ref 98–111)
Chloride: 100 mmol/L (ref 98–111)
Creatinine, Ser: 0.96 mg/dL (ref 0.44–1.00)
Creatinine, Ser: 0.98 mg/dL (ref 0.44–1.00)
GFR, Estimated: >60 mL/min (ref >60)
GFR, Estimated: 59 mL/min — ABNORMAL LOW (ref >60)
Glucose, Bld: 114 mg/dL — ABNORMAL HIGH (ref 70–99)
Glucose, Bld: 117 mg/dL — ABNORMAL HIGH (ref 70–99)
Potassium: 4 mmol/L (ref 3.5–5.1)
Potassium: 3.9 mmol/L (ref 3.5–5.1)
Sodium: 135 mmol/L (ref 135–145)
Sodium: 133 mmol/L — ABNORMAL LOW (ref 135–145)

## 2022-06-04 LAB — CBC
HCT: 37.3 % (ref 36.0–46.0)
Hemoglobin: 12.5 g/dL (ref 12.0–15.0)
MCH: 31.8 pg (ref 26.0–34.0)
MCHC: 33.5 g/dL (ref 30.0–36.0)
MCV: 94.9 fL (ref 80.0–100.0)
Platelets: 287 10*3/uL (ref 150–400)
RBC: 3.93 MIL/uL (ref 3.87–5.11)
RDW: 11.9 % (ref 11.5–15.5)
WBC: 8.8 10*3/uL (ref 4.0–10.5)
nRBC: 0 % (ref 0.0–0.2)

## 2022-06-04 LAB — TROPONIN I (HIGH SENSITIVITY): Troponin I (High Sensitivity): 6 ng/L (ref <18)

## 2022-06-04 LAB — D-DIMER, QUANTITATIVE: D-Dimer, Quant: 0.73 ug/mL-FEU — ABNORMAL HIGH (ref 0.00–0.50)

## 2022-06-04 MED ORDER — IOHEXOL 350 MG/ML SOLN
75.0000 mL | Freq: Once | INTRAVENOUS | Status: AC | PRN
  Administered 2022-06-04: 75 mL via INTRAVENOUS

## 2022-06-04 NOTE — ED Triage Notes (Signed)
First RN Note: Pt to ED via POV, pt states was called by cardiologist and told to come to ED for CT scan for possible PE. Upon review of labs, pt with noted elevated D-Dimer at 0.73.

## 2022-06-04 NOTE — Telephone Encounter (Signed)
Call transferred directly to this RN from call center.  I spoke with the patient. She advised that she has been having worsening dizziness/ SOB over the last month. She has intermittent episode of chest/ shoulder discomfort. She advised that she has a fit bit and her watch has been showing variation in her HR as well. She has seen her HR as high as 174 bpm.   The patient does sound mildly SOB over the phone, although she confirms her symptoms are not acutely worse today.   I have advised the patient that we should bring her into the office for further evaluation with a provider. I have offered her an appointment this afternoon with Dr. Saunders Revel (DOD) at 3:20 pm. She voices understanding and is agreeable.

## 2022-06-04 NOTE — ED Triage Notes (Signed)
Pt presents via POV with complaints of SOB & abnormal labs. Pt was told by her Cardiologist that she had an elevated D-Dimer (0.73 High). Pt endorses dizziness and SOB for the last several days which prompted the labs. Denies CP.

## 2022-06-04 NOTE — Patient Instructions (Signed)
Medication Instructions:   Your physician recommends that you continue on your current medications as directed. Please refer to the Current Medication list given to you today.  *If you need a refill on your cardiac medications before your next appointment, please call your pharmacy*   Lab Work:  Today: CBC w/ diff, BMET, D-Dimer, BNP  -  Please go to the [place] Entrance at [facility] in at the Registration Desk: 1st desk to the right, past the screening table  If you have labs (blood work) drawn today and your tests are completely normal, you will receive your results only by: [place] (if you have MyChart) OR A paper copy in the mail If you have any lab test that is abnormal or we need to change your treatment, we will call you to review the results.   Testing/Procedures:  Your physician has requested that you have an echocardiogram. Echocardiography is a painless test that uses sound waves to create images of your heart. It provides your doctor with information about the size and shape of your heart and how well your heart's chambers and valves are working. This procedure takes approximately one hour. There are no restrictions for this procedure.   Follow-Up: At [facility], you and your health needs are our priority.  As part of our continuing mission to provide you with exceptional heart care, we have created designated Provider Care Teams.  These Care Teams include your primary Cardiologist (physician) and Advanced Practice Providers (APPs -  Physician Assistants and Nurse Practitioners) who all work together to provide you with the care you need, when you need it.  We recommend signing up for the patient portal called "MyChart".  Sign up information is provided on this After Visit Summary.  MyChart is used to connect with patients for Virtual Visits (Telemedicine).  Patients are able to view lab/test results, encounter notes, upcoming appointments, etc.   Non-urgent messages can be sent to your provider as well.   To learn more about what you can do with MyChart, go to [email or web address].    Your next appointment:    After echo  The format for your next appointment:   In Person  Provider:   You may see Ida Rogue, MD or one of the following Advanced Practice Providers on your designated Care Team:   Murray Hodgkins, NP Christell Faith, PA-C Cadence Kathlen M[place], [place]  Important Information About [place]

## 2022-06-04 NOTE — Telephone Encounter (Signed)
I contacted [name] by phone to inform her of abnormal D-dimer and BNP findings.  Given recent worsening shortness of breath, elevated heart rates, atypical chest pain, and dizziness, I have advised her to go to the nearest emergency department for further evaluation of possible pulmonary embolism.  Given her neurologic changes documented in today's note, I think it would also be prudent to evaluate her further for possible stroke (she declined ED evaluation earlier today at the time of our office visit.  Outpatient echo is currently scheduled for next week.  However, if she is admitted from the ED, it may be worthwhile to pursue inpatient echocardiogram prior to discharge.  Melinda Bush, MD [facility]

## 2022-06-04 NOTE — ED Provider Notes (Signed)
[facility] Provider Note    Event Date/Time   First MD Initiated Contact with Patient 06/04/22 2313     (approximate)   History   Abnomal Lab and Shortness of Breath   HPI  Melinda Le is a 81 y.o. female with history of Crohn's disease, hypertension, CVA seen on MRI brain in 2011 who presents to the emergency department for concerns for shortness of breath, tachycardia and dizziness that has been ongoing for several weeks.  Seen by her cardiologist and had a D-dimer which was elevated at 0.73 and was sent to the ED for further evaluation.  She denies any fevers, cough, vomiting, diarrhea, melena.  States she has had some blood with bowel movements that she attributes to a hemorrhoid.  No abdominal pain.  Describes the dizziness as lightheadedness without aggravating relieving factors.  Denies vertigo.  Does state that she has had intermittent numbness in her left hand mostly when she is doing things like using her iPad but no numbness or focal weakness now.  No vision or speech changes.  No chest pain.  No calf tenderness or calf swelling.  No history of PE or DVT.  States she has had this dizziness intermittently since 2020 and has been seen by a neurologist and ENT.   History provided by patient and husband.    Past Medical History:  Diagnosis Date   Atypical chest pain    a. 2009 reportedly nl cath; b. 02/2015 Nl MV; c. 07/2015 Cath: nl cors [facility]).   Crohn's ileitis, without complications ([place]) 03/14/176   Dizziness    a. 05/2019 Carotid U/S: Nl RICA. LICA 9-39%. Antegrade vertebral flow; b. 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 Apparent high-grade focal stenosis of petrous RICA and paraclinoid LICA. May be exagerrated by artifact.   Heart murmur    History of echocardiogram    a. 10/2016 Echo: EF 65%, no rwma, Gr1 DD. Triv to Mild MR, Triv TR.   Hypertension    Melanoma ([place]) 1998   Resected at [facility]   Stroke [facility])    a. 06/2010  MRI brain: tiny chronic L parietal lobe cortical infarct.    Past Surgical History:  Procedure Laterality Date   BASAL CELL CARCINOMA EXCISION     CARDIAC CATHETERIZATION     Pt doesn't recall date/no stents    MEDICATIONS:  Prior to Admission medications   Medication Sig Start Date End Date Taking? Authorizing Provider  acetaminophen (TYLENOL) 650 MG CR tablet Take 650 mg by mouth daily.    [provider]  amLODipine (NORVASC) 10 MG tablet Take 1 tablet (10 mg total) by mouth daily. 07/15/21   Minna Merritts, MD  BABY ASPIRIN PO Take by mouth. occasionally    [provider]  cyanocobalamin (VITAMIN B12) 1000 MCG/ML injection Inject 1,000 mcg into the muscle every 30 (thirty) days.    [provider]  cyanocobalamin 1000 MCG tablet Take 1,000 mcg by mouth daily.    [provider]  fexofenadine (ALLEGRA) 180 MG tablet Take 180 mg by mouth daily.    [provider]  folic acid (FOLVITE) 1 MG tablet Take 1 mg by mouth daily.    [provider]  KRILL OIL PO Take by mouth daily.    [provider]  lansoprazole (PREVACID) 15 MG capsule Take 15 mg by mouth daily as needed.    [provider]  Magnesium Glycinate 665 MG CAPS Take by mouth in  the morning and at bedtime.    [provider]  metoprolol succinate (TOPROL-XL) 25 MG 24 hr tablet Take 0.5 tablets (12.5 mg total) by mouth daily. Take with or immediately following a meal. 07/15/21   Gollan, Kathlene November, MD  naproxen sodium (ALEVE) 220 MG tablet Take 220 mg by mouth daily.    [provider]  ondansetron (ZOFRAN ODT) 4 MG disintegrating tablet Take 1 tablet (4 mg total) by mouth every 8 (eight) hours as needed for nausea or vomiting. 07/05/19   Hubbard Hartshorn, FNP  rosuvastatin (CRESTOR) 5 MG tablet Take 1 tablet (5 mg total) by mouth daily. 07/15/21   Minna Merritts, MD    Physical Exam   Triage Vital Signs: ED Triage Vitals  Enc Vitals Group      BP 06/04/22 2101 (!) 180/74     Pulse Rate 06/04/22 2101 88     Resp 06/04/22 2101 18     Temp 06/04/22 2101 98.4 F (36.9 C)     Temp Source 06/04/22 2101 Oral     SpO2 06/04/22 2101 99 %     Weight 06/04/22 2100 145 lb (65.8 kg)     Height 06/04/22 2100 5' 1"  (1.549 m)     Head Circumference --      Peak Flow --      Pain Score --      Pain Loc --      Pain Edu? --      Excl. in [place]? --     Most recent vital signs: Vitals:   06/05/22 0130 06/05/22 0200  BP: 136/68 131/66  Pulse: 71 70  Resp:  18  Temp:    SpO2: 97% 98%    CONSTITUTIONAL: Alert and oriented and responds appropriately to questions. Well-appearing; well-nourished, pleasant, appears younger than stated age HEAD: Normocephalic, atraumatic EYES: Conjunctivae clear, pupils appear equal, sclera nonicteric ENT: normal nose; moist mucous membranes NECK: Supple, normal ROM CARD: RRR; S1 and S2 appreciated; no murmurs, no clicks, no rubs, no gallops RESP: Normal chest excursion without splinting or tachypnea; breath sounds clear and equal bilaterally; no wheezes, no rhonchi, no rales, no hypoxia or respiratory distress, speaking full sentences ABD/GI: Normal bowel sounds; non-distended; soft, non-tender, no rebound, no guarding, no peritoneal signs BACK: The back appears normal EXT: Normal ROM in all joints; no deformity noted, no edema; no cyanosis SKIN: Normal color for age and race; warm; no rash on exposed skin NEURO: Moves all extremities equally, normal speech, normal sensation diffusely, cranial nerves II through XII intact, no ataxia, no drift PSYCH: The patient's mood and manner are appropriate.   ED Results / Procedures / Treatments   LABS: (all labs ordered are listed, but only abnormal results are displayed) Labs Reviewed  BASIC METABOLIC PANEL - Abnormal; Notable for the following components:      Result Value   Sodium 133 (*)    Glucose, Bld 117 (*)    GFR, Estimated 59 (*)    All other  components within normal limits  BRAIN NATRIURETIC PEPTIDE - Abnormal; Notable for the following components:   B Natriuretic Peptide 141.2 (*)    All other components within normal limits  URINALYSIS, ROUTINE W REFLEX MICROSCOPIC - Abnormal; Notable for the following components:   Color, Urine COLORLESS (*)    APPearance CLEAR (*)    Leukocytes,Ua LARGE (*)    Bacteria, UA RARE (*)    All other components within normal limits  URINE CULTURE  CBC  TROPONIN I (HIGH SENSITIVITY)  TROPONIN I (HIGH SENSITIVITY)     EKG:  EKG Interpretation  Date/Time:  Friday June 04 2022 21:05:21 EDT Ventricular Rate:  81 PR Interval:  160 QRS Duration: 90 QT Interval:  370 QTC Calculation: 429 R Axis:   -9 Text Interpretation: Normal sinus rhythm Nonspecific ST abnormality Abnormal ECG When compared with ECG of 08-Apr-2019 03:09, Questionable change in QRS axis Confirmed by Pryor Curia [phone]) on 06/05/2022 12:11:21 AM         RADIOLOGY: My personal review and interpretation of imaging: CTA chest shows no PE, CHF, pneumonia.  MRI brain shows subacute to chronic infarct in the left parietal lobe.  I have personally reviewed all radiology reports.   MR BRAIN WO CONTRAST  Result Date: 06/05/2022 CLINICAL DATA:  Initial evaluation for neuro deficit, stroke suspected, dizziness. EXAM: MRI HEAD WITHOUT CONTRAST TECHNIQUE: Multiplanar, multiecho pulse sequences of the brain and surrounding structures were obtained without intravenous contrast. COMPARISON:  Prior MRI from 06/19/2019. FINDINGS: Brain: Cerebral volume within normal limits. Few scattered patchy foci of T2/FLAIR hyperintensity involving the supratentorial cerebral white matter, nonspecific, but most likely related chronic microvascular ischemic disease. Overall appearance of minimal for age. Tiny remote cortical to subcortical infarct noted at the parasagittal left parietal lobe (series 15, image 38). No evidence for acute or subacute  ischemia. Gray-white matter differentiation maintained. No other areas of chronic cortical infarction. No acute or chronic intracranial blood products. No mass lesion, midline shift or mass effect no hydrocephalus or extra-axial fluid collection. Pituitary gland and suprasellar region within normal limits. Vascular: Major intracranial vascular flow voids are maintained. Skull and upper cervical spine: Craniocervical junction normal. Bone marrow signal intensity within normal limits. No scalp soft tissue abnormality. Sinuses/Orbits: Prior ocular lens replacement on the left. Paranasal sinuses are largely clear. No mastoid effusion. Other: None. IMPRESSION: 1. No acute intracranial abnormality. 2. Small remote cortical to subcortical infarct involving the parasagittal left parietal lobe. 3. Underlying mild cerebral white matter disease, nonspecific, but most commonly related to chronic microvascular ischemic disease. Appearance is minimal for age. Electronically Signed   By: Jeannine Boga M.D.   On: 06/05/2022 01:46   CT Angio Chest PE W and/or Wo Contrast  Result Date: 06/04/2022 CLINICAL DATA:  Positive D-dimer.  Concern for pulmonary embolism. EXAM: CT ANGIOGRAPHY CHEST WITH CONTRAST TECHNIQUE: Multidetector CT imaging of the chest was performed using the standard protocol during bolus administration of intravenous contrast. Multiplanar CT image reconstructions and MIPs were obtained to evaluate the vascular anatomy. RADIATION DOSE REDUCTION: This exam was performed according to the departmental dose-optimization program which includes automated exposure control, adjustment of the mA and/or kV according to patient size and/or use of iterative reconstruction technique. CONTRAST:  73m OMNIPAQUE IOHEXOL 350 MG/ML SOLN COMPARISON:  Chest CT dated 04/29/2016 and radiograph dated 06/04/2022. FINDINGS: Cardiovascular: There is no cardiomegaly or pericardial effusion. Mild atherosclerotic calcification of the  thoracic aorta. No aneurysmal dilatation or dissection. The origins of the great vessels of the aortic arch appear patent. No pulmonary artery embolus identified. Mediastinum/Nodes: No hilar or mediastinal adenopathy. The esophagus is grossly unremarkable. No mediastinal fluid collection. Lungs/Pleura: The lungs are clear. There is no pleural effusion or pneumothorax. The central airways are patent. Upper Abdomen: Bilateral adrenal thickening/hyperplasia. Similar appearance of a 2 cm right liver cyst. Scattered colonic diverticula. Musculoskeletal: Degenerative changes of the spine. No acute osseous pathology. Review of the MIP images confirms the above findings. IMPRESSION: 1. No acute  intrathoracic pathology. No CT evidence of pulmonary artery embolus. 2. Aortic Atherosclerosis (ICD10-I70.0). Electronically Signed   By: Anner Crete M.D.   On: 06/04/2022 23:47   DG Chest 2 View  Result Date: 06/04/2022 CLINICAL DATA:  Shortness of breath and chest pain EXAM: CHEST - 2 VIEW COMPARISON:  04/08/2019 FINDINGS: Cardiac shadow is within normal limits. Stable aortic calcifications are seen. No focal infiltrate or sizable effusion is seen. No acute bony abnormality is noted. IMPRESSION: No acute abnormality noted. Electronically Signed   By: Inez Catalina M.D.   On: 06/04/2022 21:28     PROCEDURES:  Critical Care performed: No     .1-3 Lead EKG Interpretation  Performed by: Kealie Barrie, Delice Bison, DO Authorized by: Marysol Wellnitz, Delice Bison, DO     Interpretation: normal     ECG rate:  88   ECG rate assessment: normal     Rhythm: sinus rhythm     Ectopy: none     Conduction: normal       IMPRESSION / MDM / ASSESSMENT AND PLAN / ED COURSE  I reviewed the triage vital signs and the nursing notes.    Patient here with complaints of shortness of breath, tachycardia, dizziness.  Symptoms ongoing for weeks.  Dizziness intermittent for years.  Sent here by cardiology for further work-up for possible PE given  D-dimer of 0.73 and possible work-up for stroke.  The patient is on the cardiac monitor to evaluate for evidence of arrhythmia and/or significant heart rate changes.   DIFFERENTIAL DIAGNOSIS (includes but not limited to):   ACS, PE, dissection, CHF, CVA, orthostasis, dehydration, UTI, anemia, electrolyte derangement   Patient's presentation is most consistent with acute presentation with potential threat to life or bodily function.   PLAN: We will obtain CBC, BMP, troponin x2, BNP, CTA of the chest, MRI of the brain, urinalysis, orthostatic vital signs.  She is neurologically intact here without any ataxia.  She does not describe her dizziness as vertigo.  It has been ongoing intermittently since 2020.  She has seen a neurologist and states that they told her that she could be having vestibular migraines and occipital neuralgia.  She has tried Epley maneuvers at home religiously for about a year and a half without any relief of her dizziness.  She does describe some intermittent left hand numbness but mostly with using her iPad that resolves when she puts the iPad down.  No numbness or weakness today.   MEDICATIONS GIVEN IN ED: Medications  iohexol (OMNIPAQUE) 350 MG/ML injection 75 mL (75 mLs Intravenous Contrast Given 06/04/22 2338)  cephALEXin (KEFLEX) capsule 500 mg (500 mg Oral Given 06/05/22 0205)     ED COURSE: Patient's labs show normal hemoglobin, no leukocytosis, normal electrolytes and glucose.  Troponin x2 negative.  BNP minimally elevated at 141.  Chest x-ray reviewed and interpreted by myself and radiologist and shows no acute abnormality.  CTA of the chest also reviewed and interpreted by myself and radiology and shows no PE or other acute abnormality.  Urine does show possible mild infection with large leukocyte esterase, 11-20 white blood cells and rare bacteria.  We will add on a urine culture and start her on Keflex.  She does report she has had urinary frequency recently.  MRI  of the brain reviewed and interpreted by myself and the radiologist and shows a small remote cortical to subcortical infarct in the left parietal lobe but no acute abnormality.  Low suspicion that symptoms today are due to  TIA given chronicity.  We did discuss the possibility of admission to the hospital for her symptoms but she is not having any shortness of breath or palpitations currently and is currently neurologically intact and would like discharge home.  She is comfortable with follow-up with her outpatient cardiologist, PCP and neurologist.  Discussed return precautions with patient and husband.   At this time, I do not feel there is any life-threatening condition present. I reviewed all nursing notes, vitals, pertinent previous records.  All lab and urine results, EKGs, imaging ordered have been independently reviewed and interpreted by myself.  I reviewed all available radiology reports from any imaging ordered this visit.  Based on my assessment, I feel the patient is safe to be discharged home without further emergent workup and can continue workup as an outpatient as needed. Discussed all findings, treatment plan as well as usual and customary return precautions.  They verbalize understanding and are comfortable with this plan.  Outpatient follow-up has been provided as needed.  All questions have been answered.    CONSULTS: Admission considered and offered but patient declined stating she is feeling well and wants to go home and has had a reassuring work-up here in the ED.   OUTSIDE RECORDS REVIEWED: Reviewed patient's last internal medicine note on 06/02/2022 and notes from cardiology today.       FINAL CLINICAL IMPRESSION(S) / ED DIAGNOSES   Final diagnoses:  SOB (shortness of breath)  Dizziness  Acute UTI     Rx / DC Orders   ED Discharge Orders          Ordered    cephALEXin (KEFLEX) 500 MG capsule  2 times daily        06/05/22 0159             Note:  This  document was prepared using Dragon voice recognition software and may include unintentional dictation errors.   Amyla Heffner, Delice Bison, DO 06/05/22 [phone]

## 2022-06-04 NOTE — Telephone Encounter (Signed)
Pt c/o Shortness Of Breath: STAT if SOB developed within the last 24 hours or pt is noticeably SOB on the phone  1. Are you currently SOB (can you hear that pt is SOB on the phone)? Yes, off and on all day   2. How long have you been experiencing SOB? Noticed it about a month ago   3. Are you SOB when sitting or when up moving around? Moving around  4. Are you currently experiencing any other symptoms? Pt called stating she has been feeling very dizzy and sob. She states she noticed that her hr was 104 a couple of times. She states she has some pains in her chest sometimes. Pt mae an appt with Dr. Rockey Situ 06/08/22 at 8:40. Call transferred to triage

## 2022-06-04 NOTE — Progress Notes (Signed)
Follow-up Outpatient Visit Date: 06/04/2022  Primary Care Provider: Gladstone Lighter, [place] [address]  Primary Cardiologist: Esmond Plants MD PhD  Chief Complaint: Shortness of breath, dizziness, and elevated heart rates  HPI:  Melinda Le is a 81 y.o. female with history of hypertension, hyperlipidemia, stroke, Crohn's disease, and melanoma, who presents for urgent evaluation of shortness of breath.  She last saw Dr. Rockey Situ in 07/2021, at which time she she was "doing well."  She reached out to our office today complaining of worsening dizziness and shortness of breath over the last month as well as chest discomfort.  She reported variable heart rates up to 174 bpm.  She saw her PCP 2 days ago, at which time she reported stable dizziness.  Heart rate and blood pressure were normal at that time.  She was advised to continue follow-up with neurology and movement disorder specialist.  Today, Melinda Le has multiple complaints.  For the last week, she has felt more short of breath that is present with essentially any activity.  She also states that her heart rate "resumes up" whenever she stands up or moves around.  On a brief walk a few days ago she recorded heart rates up to 174 bpm on her Fitbit.  She did not mention these concerns to Dr. Tressia Miners at their visit 2 days ago.  She also reports several different forms of chest pain.  She has pain in her upper back between her shoulder blades when doing certain activities.  She also has experienced some shooting pains in the front of her chest and on the left side of her chest at times.  She describes it as a tightness that she associates with her bra strap.  The pain is not clearly exertional.  She has felt some increased swelling in her legs over the last week or two.  She denies a history of blood clots, recent surgery, or immobilization/prolonged travel.  In regard to her dizziness, Melinda Le reports that this  dates back to May 2022.  She has undergone extensive neurologic evaluation without a clear cause identified.  At one point, there was concern for prior stroke and significant intracranial carotid disease.  However, further work-up at [facility] did not confirm the latter.  She does not follow with Dr. Manuella Ghazi, her neurologist, regularly anymore.  The dizziness seems to be worse when she stands.  She feels best when she is lying flat.  She describes it as being off balance and somewhat lightheadedness.  She denies vertigo.  She tries to stay well-hydrated, drinking at least 36 ounces of water first thing in the morning and then continuing to drink water, coffee, and tea throughout the day.  She feels like the dizziness became noticeably worse 2 weeks ago.  There were no obvious precipitants.  She has not had any other focal neurologic changes though she does report some generalized weakness as well as shakiness that dates back to 2020.  On further questioning, she notes that she sometimes confuses left and right and jumble some words.  She has not fallen.  Labs were obtained through Dr. Wyatt Portela office earlier this month and did not show any obvious abnormalities to explain the patient's symptoms.  --------------------------------------------------------------------------------------------------  Past Medical History:  Diagnosis Date   Atypical chest pain    a. 2009 reportedly nl cath; b. 02/2015 Nl MV; c. 07/2015 Cath: nl cors [facility]).   Crohn's ileitis, without complications ([place]) 12/12/5359   Dizziness  a. 05/2019 Carotid U/S: Nl RICA. LICA 6-16%. Antegrade vertebral flow; b. 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 Apparent high-grade focal stenosis of petrous RICA and paraclinoid LICA. May be exagerrated by artifact.   Heart murmur    History of echocardiogram    a. 10/2016 Echo: EF 65%, no rwma, Gr1 DD. Triv to Mild MR, Triv TR.   Hypertension    Melanoma ([place]) 1998   Resected at [facility]    Stroke [facility])    a. 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   Past Surgical History:  Procedure Laterality Date   BASAL CELL CARCINOMA EXCISION     CARDIAC CATHETERIZATION     Pt doesn't recall date/no stents    Current Meds  Medication Sig   acetaminophen (TYLENOL) 650 MG CR tablet Take 650 mg by mouth daily.   amLODipine (NORVASC) 10 MG tablet Take 1 tablet (10 mg total) by mouth daily.   BABY ASPIRIN PO Take by mouth. occasionally   cyanocobalamin (VITAMIN B12) 1000 MCG/ML injection Inject 1,000 mcg into the muscle every 30 (thirty) days.   cyanocobalamin 1000 MCG tablet Take 1,000 mcg by mouth daily.   fexofenadine (ALLEGRA) 180 MG tablet Take 180 mg by mouth daily.   folic acid (FOLVITE) 1 MG tablet Take 1 mg by mouth daily.   KRILL OIL PO Take by mouth daily.   lansoprazole (PREVACID) 15 MG capsule Take 15 mg by mouth daily as needed.   Magnesium Glycinate 665 MG CAPS Take by mouth in the morning and at bedtime.   metoprolol succinate (TOPROL-XL) 25 MG 24 hr tablet Take 0.5 tablets (12.5 mg total) by mouth daily. Take with or immediately following a meal.   naproxen sodium (ALEVE) 220 MG tablet Take 220 mg by mouth daily.   ondansetron (ZOFRAN ODT) 4 MG disintegrating tablet Take 1 tablet (4 mg total) by mouth every 8 (eight) hours as needed for nausea or vomiting.   rosuvastatin (CRESTOR) 5 MG tablet Take 1 tablet (5 mg total) by mouth daily.    Allergies: Penicillins  Social History   Tobacco Use   Smoking status: Never   Smokeless tobacco: Never  Vaping Use   Vaping Use: Never used  Substance Use Topics   Alcohol use: Yes    Alcohol/week: 1.0 standard drink of alcohol    Types: 1 Glasses of wine per week    Comment: occasional   Drug use: Never    Family History  Problem Relation Age of Onset   Heart Problems Mother    Breast cancer Neg Hx     Review of Systems: A 12-system review of systems was performed and was negative except  as noted in the HPI.  --------------------------------------------------------------------------------------------------  Physical Exam: BP (!) 183/81 (BP Location: Left Arm, Patient Position: Sitting, Cuff Size: Normal)   Pulse 85   Ht 5' 1"  (1.549 m)   Wt 145 lb (65.8 kg)   SpO2 99%   BMI 27.40 kg/m  Position Blood pressure (mmHg) Heart rate (bpm)  Lying 176/74 82  Sitting 184/83 83  Standing 177/80 90  Standing (3 minutes) 152/80 97   General:  NAD.  She is accompanied by her husband. Neck: No JVD or HJR. Lungs: Clear to auscultation bilaterally without wheezes or crackles. Heart: Regular rate and rhythm without murmurs, rubs, or gallops. Abdomen: Soft, nontender, nondistended. Extremities: Trace pretibial edema bilaterally. Neuro: Significant ataxia noted, especially with initiation of movements.  Strength is 4+/5 in  the upper and lower extremities bilaterally.  EKG: Normal sinus rhythm with possible septal infarct and nonspecific ST segment changes.  No significant change from prior tracing on 07/15/2021.  Lab Results  Component Value Date   WBC 6.9 04/13/2019   HGB 14.1 04/13/2019   HCT 42.0 04/13/2019   MCV 94.8 04/13/2019   PLT 298 04/13/2019    Lab Results  Component Value Date   NA 142 01/03/2020   K 4.6 01/03/2020   CL 103 01/03/2020   CO2 22 01/03/2020   BUN 14 01/03/2020   CREATININE 0.94 01/03/2020   GLUCOSE 99 01/03/2020   ALT 15 01/03/2020    Lab Results  Component Value Date   CHOL 187 01/03/2020   HDL 110 01/03/2020   LDLCALC 66 01/03/2020   TRIG 59 01/03/2020   CHOLHDL 1.7 01/03/2020    --------------------------------------------------------------------------------------------------  ASSESSMENT AND PLAN: Shortness of breath and atypical chest pain: Ms. Skilton reports worsening shortness of breath with minimal activity over the last 2 weeks.  She also has experienced some chest pain of several qualities.  The chest pain is not clearly  exertional and seems to predate her worsening shortness of breath.  Her physical examination today is notable for trace pretibial edema.  EKG shows sinus rhythm with nonspecific ST changes and poor R wave progression in V1 and V2.  While her symptoms are nonspecific, given her report of significant tachycardia and dizziness as well as progressive shortness of breath, I have recommended checking a D-dimer and BNP today.  If the D-dimer is elevated, Ms. Joines will need to go to the ED for further evaluation.  We will also arrange for an echocardiogram at her earliest convenience.  My suspicion for ACS is low given the atypical nature of chest pain.  However, if alternative cause for her symptoms is not identified and they persist, noninvasive ischemia evaluation will need to be considered.  Dizziness and tachycardia: Dizziness has been present for over 3 years and has been evaluated extensively.  Ms. Zemanek is concerned that it had worsened suddenly over the last week or two.  She displays significant movement disorder and ataxia today, which she reports is longstanding but also seems more pronounced.  While she has not had any focal weakness, she notes that she has been mixing up her sides and jumbling some words.  I advised her that this could be evidence of a stroke and that ED evaluation is recommended.  However, she declined.  Exam today is notable for positive orthostatic vital signs in the setting of supine hypertension.  I encouraged her to continue to stay well-hydrated.  She may have some underlying autonomic dysfunction.  I advised her to seek immediate medical attention if she develops any new neurologic deficits and to consider following up with neurology for further assessment.  Hypertension: Patient has significantly elevated blood pressure though orthostatic hypotension with associated lightheadedness and "head fullness" appreciated today.  Ms. Stefanko reports that she stays well-hydrated.   For now, we will plan to continue amlodipine and metoprolol pending further work-up as outlined above.  Follow-up: Return to clinic shortly after completion of echocardiogram with Dr. Rockey Situ or APP.  Nelva Bush, MD 06/04/2022 3:45 PM

## 2022-06-05 ENCOUNTER — Emergency Department: Payer: Medicare Other

## 2022-06-05 DIAGNOSIS — R0602 Shortness of breath: Secondary | ICD-10-CM | POA: Diagnosis not present

## 2022-06-05 LAB — URINALYSIS, ROUTINE W REFLEX MICROSCOPIC
Bilirubin Urine: NEGATIVE
Glucose, UA: NEGATIVE mg/dL
Hgb urine dipstick: NEGATIVE
Ketones, ur: NEGATIVE mg/dL
Nitrite: NEGATIVE
Protein, ur: NEGATIVE mg/dL
Specific Gravity, Urine: 1.011 (ref 1.005–1.030)
pH: 7 (ref 5.0–8.0)

## 2022-06-05 LAB — TROPONIN I (HIGH SENSITIVITY): Troponin I (High Sensitivity): 6 ng/L (ref <18)

## 2022-06-05 MED ORDER — CEPHALEXIN 500 MG PO CAPS
500.0000 mg | ORAL_CAPSULE | Freq: Once | ORAL | Status: AC
Start: 2022-06-05 — End: 2022-06-05
  Administered 2022-06-05: 500 mg via ORAL
  Filled 2022-06-05: qty 1

## 2022-06-05 MED ORDER — CEPHALEXIN 500 MG PO CAPS: 500.0000 mg | ORAL_CAPSULE | Freq: Two times a day (BID) | ORAL | 0 refills | Status: DC

## 2022-06-05 NOTE — ED Notes (Signed)
Pt endorses SOB and dizziness that started a couple of days ago. Pt appears to be in no distress. Pt also came in due to PCP saying D-dimer was elevated and should come to ER to have CT Scan. Pt told this nurse "there is nothing the matter with me".

## 2022-06-05 NOTE — Discharge Instructions (Signed)
I recommend close follow-up with your cardiologist and neurologist as an outpatient.

## 2022-06-06 LAB — URINE CULTURE: Culture: 80000 — AB

## 2022-06-07 ENCOUNTER — Encounter: Payer: Self-pay | Admitting: Internal Medicine

## 2022-06-08 ENCOUNTER — Ambulatory Visit: Payer: Medicare Other | Admitting: Cardiovascular Disease

## 2022-06-09 ENCOUNTER — Other Ambulatory Visit: Payer: Self-pay | Admitting: Internal Medicine

## 2022-06-09 DIAGNOSIS — N63 Unspecified lump in unspecified breast: Secondary | ICD-10-CM

## 2022-06-09 DIAGNOSIS — N644 Mastodynia: Secondary | ICD-10-CM

## 2022-06-10 ENCOUNTER — Ambulatory Visit (INDEPENDENT_AMBULATORY_CARE_PROVIDER_SITE_OTHER): Payer: Medicare Other

## 2022-06-10 DIAGNOSIS — R000 Tachycardia, unspecified: Secondary | ICD-10-CM

## 2022-06-10 DIAGNOSIS — R0602 Shortness of breath: Secondary | ICD-10-CM | POA: Diagnosis not present

## 2022-06-10 LAB — ECHOCARDIOGRAM COMPLETE
AR max vel: 2 cm2
AV Area VTI: 1.94 cm2
AV Area mean vel: 1.97 cm2
AV Mean grad: 4 mmHg
AV Peak grad: 6.3 mmHg
Ao pk vel: 1.25 m/s
Area-P 1/2: 3.89 cm2
Calc EF: 79 %
S' Lateral: 2.3 cm
Single Plane A2C EF: 81.4 %
Single Plane A4C EF: 78 %

## 2022-06-11 ENCOUNTER — Telehealth: Payer: Self-pay | Admitting: *Deleted

## 2022-06-11 NOTE — Telephone Encounter (Signed)
-----   Message from Nelva Bush, MD sent at 06/11/2022 10:15 AM EDT ----- Please let Ms. Birkhead know that her heart is contracting well, though there is some stiffening of the heart muscle and mildly elevated pressure in her pulmonary arteries.  These findings can sometimes contribute to shortness of breath and leg swelling.  No significant valvular abnormality is seen.  I suggest that we have Ms. Marte start taking furosemide 20 mg PO daily and follow-up as scheduled next week to reassess her symptoms.

## 2022-06-11 NOTE — Telephone Encounter (Signed)
Attempted to call pt. No answer. Lmtcb.  

## 2022-06-14 MED ORDER — FUROSEMIDE 20 MG PO TABS: 20.0000 mg | ORAL_TABLET | Freq: Every day | ORAL | 6 refills | Status: DC

## 2022-06-14 NOTE — Telephone Encounter (Signed)
Pt returning nurses call regarding results. Please advise

## 2022-06-14 NOTE — Telephone Encounter (Signed)
Attempted to call the patient on her cell #. No answer- I left a message to call back but would also try her alternate #.  I was able to reach the patient at her home #.  I have reviewed the results of her echocardiogram with her and Dr. Darnelle Bos recommendations to: - Start lasix 20 mg once daily  - f/u as scheduled on 8/10   The patient voices understanding of the above results and recommendations and is agreeable. I have advised her if she picks up her RX after 2:00 pm today, just wait until tomorrow morning to take the 1st dose.  The patient again voiced understanding.

## 2022-06-15 NOTE — Progress Notes (Unsigned)
Cardiology Clinic Note   Patient Name: Melinda Le Date of Encounter: 06/15/2022  Primary Care Provider:  Gladstone Lighter, MD Primary Cardiologist:  Ida Rogue, MD  Patient Profile    81 year old female with a history of atypical chest pain and normal coronary arteries by catheterization in 2016, hypertension, hyperlipidemia, Crohn's disease, melanoma, and dizziness who was found to have had remote stroke on MRI, who presents for follow-up today for hypertension, shortness of breath, and dizziness.  Past Medical History    Past Medical History:  Diagnosis Date   Atypical chest pain    a. 2009 reportedly nl cath; b. 02/2015 Nl MV; c. 07/2015 Cath: nl cors [facility]).   Crohn's ileitis, without complications ([place]) 06/12/4626   Dizziness    a. 05/2019 Carotid U/S: Nl RICA. LICA 0-35%. Antegrade vertebral flow; b. 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 Apparent high-grade focal stenosis of petrous RICA and paraclinoid LICA. May be exagerrated by artifact.   Heart murmur    History of echocardiogram    a. 10/2016 Echo: EF 65%, no rwma, Gr1 DD. Triv to Mild MR, Triv TR.   Hypertension    Melanoma ([place]) 1998   Resected at [facility]   Stroke [facility])    a. 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   Past Surgical History:  Procedure Laterality Date   BASAL CELL CARCINOMA EXCISION     CARDIAC CATHETERIZATION     Pt doesn't recall date/no stents    Allergies  Allergies  Allergen Reactions   Penicillins Hives    History of Present Illness    Melinda Le is a 81 year old female with aforementioned past medical history of atypical chest pain with normal coronary arteries by left heart catheterization completed in 2016, hypertension, hyperlipidemia, Crohn's disease, melanoma, and chronic dizziness who was found to have had a remote stroke on MRI.  Dizziness dating back to May 2022 where she had undergone extensive neurological evaluation  without clear cause identified.  There was concern for prior stroke with significant intercranial carotid disease.  However further workup at [facility] did not confirm the latter.  She had previously been following with Dr. Manuella Ghazi her neurologist but was not regularly keeping appointments at this time.  The dizziness she said was worse when she stands and feels better when she was lying flat.  She describes from being off balance and somewhat lightheaded.  She denies symptoms of vertigo.  She was last seen in clinic in 06/04/2022 as a work in for complaints of worsening dizziness and shortness of breath over the last month as well as some chest discomfort.  She reported variable heart rates of up to 174 bpm.  She just previously seen her PCP 2 days prior to that and reported stable dizziness.  Heart rate and blood pressure were normal at that time.  She was advised to continue to follow-up with neurology and movement disorder specialist.  She was sent for blood work and a D-dimer that resulted 0.73 and a BNP that resulted at 187.9.  For elevated D-dimer and a recent worsening shortness of breath, elevated heart rates, atypical chest pain, dizziness she was advised to present for the emergency department to be evaluated for possible pulmonary embolism.  She had a CTA of her chest to rule out a pulmonary embolism and showed no acute intrathoracic pathology, no CT evidence of pulmonary embolism.  For dizziness she underwent MRI of the brain which revealed no acute intracranial  abnormality, small remote cortical to subcortical infarct involving the parasagittal left parietal lobe, underlying mild cerebral white matter disease, nonspecific. She was scheduled for an echocardiogram that revealed LVEF of 6065%, no regional wall motion abnormalities, G2 DD, mild mitral regurge.  She was then subsequently started on furosemide 20 mg daily.  She returns to clinic today for reevaluation of her symptoms.  Home Medications     Current Outpatient Medications  Medication Sig Dispense Refill   acetaminophen (TYLENOL) 650 MG CR tablet Take 650 mg by mouth daily.     amLODipine (NORVASC) 10 MG tablet Take 1 tablet (10 mg total) by mouth daily. 90 tablet 3   BABY ASPIRIN PO Take by mouth. occasionally     cephALEXin (KEFLEX) 500 MG capsule Take 1 capsule (500 mg total) by mouth 2 (two) times daily. 14 capsule 0   cyanocobalamin (VITAMIN B12) 1000 MCG/ML injection Inject 1,000 mcg into the muscle every 30 (thirty) days.     cyanocobalamin 1000 MCG tablet Take 1,000 mcg by mouth daily.     fexofenadine (ALLEGRA) 180 MG tablet Take 180 mg by mouth daily.     folic acid (FOLVITE) 1 MG tablet Take 1 mg by mouth daily.     furosemide (LASIX) 20 MG tablet Take 1 tablet (20 mg total) by mouth daily. 30 tablet 6   KRILL OIL PO Take 1 tablet by mouth daily at 12 noon.     lansoprazole (PREVACID) 15 MG capsule Take 15 mg by mouth daily as needed.     Magnesium Glycinate 665 MG CAPS Take by mouth in the morning and at bedtime.     metoprolol succinate (TOPROL-XL) 25 MG 24 hr tablet Take 0.5 tablets (12.5 mg total) by mouth daily. Take with or immediately following a meal. 45 tablet 3   naproxen sodium (ALEVE) 220 MG tablet Take 220 mg by mouth daily.     ondansetron (ZOFRAN ODT) 4 MG disintegrating tablet Take 1 tablet (4 mg total) by mouth every 8 (eight) hours as needed for nausea or vomiting. 30 tablet 1   rosuvastatin (CRESTOR) 5 MG tablet Take 1 tablet (5 mg total) by mouth daily. 90 tablet 3   No current facility-administered medications for this visit.     Family History    Family History  Problem Relation Age of Onset   Heart Problems Mother    Breast cancer Neg Hx    She indicated that her mother is deceased. She indicated that her father is deceased. She indicated that the status of her neg hx is unknown.  Social History    Social History   Socioeconomic History   Marital status: Married    Spouse name:  Elenore Rota   Number of children: 3   Years of education: Not on file   Highest education level: Not on file  Occupational History   Not on file  Tobacco Use   Smoking status: Never   Smokeless tobacco: Never  Vaping Use   Vaping Use: Never used  Substance and Sexual Activity   Alcohol use: Yes    Alcohol/week: 1.0 standard drink of alcohol    Types: 1 Glasses of wine per week    Comment: occasional   Drug use: Never   Sexual activity: Not Currently  Other Topics Concern   Not on file  Social History Narrative   Not on file   Social Determinants of Health   Financial Resource Strain: Low Risk  (04/13/2019)   Overall Financial Resource  Strain (CARDIA)    Difficulty of Paying Living Expenses: Not hard at all  Food Insecurity: No Food Insecurity (04/13/2019)   Hunger Vital Sign    Worried About Running Out of Food in the Last Year: Never true    Ran Out of Food in the Last Year: Never true  Transportation Needs: No Transportation Needs (04/13/2019)   PRAPARE - Hydrologist (Medical): No    Lack of Transportation (Non-Medical): No  Physical Activity: Not on file  Stress: Not on file  Social Connections: Not on file  Intimate Partner Violence: Not on file     Review of Systems    General:  No chills, fever, night sweats or weight changes.  Cardiovascular:  No chest pain, dyspnea on exertion, edema, orthopnea, palpitations, paroxysmal nocturnal dyspnea. Dermatological: No rash, lesions/masses Respiratory: No cough, dyspnea Urologic: No hematuria, dysuria Abdominal:   No nausea, vomiting, diarrhea, bright red blood per rectum, melena, or hematemesis Neurologic:  No visual changes, wkns, changes in mental status. All other systems reviewed and are otherwise negative except as noted above.     Physical Exam    VS:  There were no vitals taken for this visit. , BMI There is no height or weight on file to calculate BMI.     GEN: Well nourished, well  developed, in no acute distress. HEENT: normal. Neck: Supple, no JVD, carotid bruits, or masses. Cardiac: RRR, no murmurs, rubs, or gallops. No clubbing, cyanosis, edema.  Radials/DP/PT 2+ and equal bilaterally.  Respiratory:  Respirations regular and unlabored, clear to auscultation bilaterally. GI: Soft, nontender, nondistended, BS + x 4. MS: no deformity or atrophy. Skin: warm and dry, no rash. Neuro:  Strength and sensation are intact. Psych: Normal affect.  Accessory Clinical Findings    ECG personally reviewed by me today- *** - No acute changes  Lab Results  Component Value Date   WBC 8.8 06/04/2022   HGB 12.5 06/04/2022   HCT 37.3 06/04/2022   MCV 94.9 06/04/2022   PLT 287 06/04/2022   Lab Results  Component Value Date   CREATININE 0.98 06/04/2022   BUN 14 06/04/2022   NA 133 (L) 06/04/2022   K 3.9 06/04/2022   CL 100 06/04/2022   CO2 25 06/04/2022   Lab Results  Component Value Date   ALT 15 01/03/2020   AST 19 01/03/2020   ALKPHOS 91 01/03/2020   BILITOT 0.5 01/03/2020   Lab Results  Component Value Date   CHOL 187 01/03/2020   HDL 110 01/03/2020   LDLCALC 66 01/03/2020   TRIG 59 01/03/2020   CHOLHDL 1.7 01/03/2020    Lab Results  Component Value Date   HGBA1C 5.5 04/13/2019    Assessment & Plan   1.  ***  Naveh Rickles, NP 06/15/2022, 1:45 PM

## 2022-06-17 ENCOUNTER — Ambulatory Visit (INDEPENDENT_AMBULATORY_CARE_PROVIDER_SITE_OTHER): Payer: Medicare Other | Admitting: Cardiology

## 2022-06-17 ENCOUNTER — Encounter: Payer: Self-pay | Admitting: Cardiology

## 2022-06-17 VITALS — BP 136/79 | HR 68 | Ht 61.0 in | Wt 142.4 lb

## 2022-06-17 DIAGNOSIS — R0602 Shortness of breath: Secondary | ICD-10-CM

## 2022-06-17 DIAGNOSIS — I10 Essential (primary) hypertension: Secondary | ICD-10-CM

## 2022-06-17 DIAGNOSIS — E782 Mixed hyperlipidemia: Secondary | ICD-10-CM

## 2022-06-17 DIAGNOSIS — I700 Atherosclerosis of aorta: Secondary | ICD-10-CM

## 2022-06-17 DIAGNOSIS — R000 Tachycardia, unspecified: Secondary | ICD-10-CM

## 2022-06-17 DIAGNOSIS — I739 Peripheral vascular disease, unspecified: Secondary | ICD-10-CM

## 2022-06-17 DIAGNOSIS — I6523 Occlusion and stenosis of bilateral carotid arteries: Secondary | ICD-10-CM

## 2022-06-17 DIAGNOSIS — R42 Dizziness and giddiness: Secondary | ICD-10-CM

## 2022-06-17 MED ORDER — METOPROLOL SUCCINATE ER 25 MG PO TB24: 12.5000 mg | ORAL_TABLET | Freq: Every day | ORAL | 3 refills | Status: DC

## 2022-06-17 MED ORDER — ATORVASTATIN CALCIUM 10 MG PO TABS: 10.0000 mg | ORAL_TABLET | Freq: Every day | ORAL | 3 refills | Status: DC

## 2022-06-17 NOTE — Patient Instructions (Addendum)
Medication Instructions:  Your physician has recommended you make the following change in your medication:   STOP Rosuvastatin  START Atorvastatin 10 mg once daily   *If you need a refill on your cardiac medications before your next appointment, please call your pharmacy*   Lab Work: Lipid & LFT to be done in 3 months (09/17/22) over at the [place] [facility] then go to 1st desk on the right to check in (REGISTRATION). No appointment is needed for this.    Lab hours: Monday- Friday (7:30 am- 5:30 pm)  If you have labs (blood work) drawn today and your tests are completely normal, you will receive your results only by: [place] (if you have MyChart) OR A paper copy in the mail If you have any lab test that is abnormal or we need to change your treatment, we will call you to review the results.   Testing/Procedures: None   Follow-Up: At [facility], you and your health needs are our priority.  As part of our continuing mission to provide you with exceptional heart care, we have created designated Provider Care Teams.  These Care Teams include your primary Cardiologist (physician) and Advanced Practice Providers (APPs -  Physician Assistants and Nurse Practitioners) who all work together to provide you with the care you need, when you need it.  Your next appointment:   4 month(s)  The format for your next appointment:   In Person  Provider:   Ida Rogue, MD or Gerrie Nordmann, NP {      Important Information About Sugar

## 2022-06-28 ENCOUNTER — Ambulatory Visit
Admission: RE | Admit: 2022-06-28 | Discharge: 2022-06-28 | Disposition: A | Discharge status: Home / Self Care | Payer: Medicare Other | Source: Ambulatory Visit | Attending: Internal Medicine | Admitting: Internal Medicine

## 2022-06-28 ENCOUNTER — Other Ambulatory Visit: Payer: Self-pay | Admitting: Internal Medicine

## 2022-06-28 DIAGNOSIS — N63 Unspecified lump in unspecified breast: Secondary | ICD-10-CM | POA: Insufficient documentation

## 2022-06-28 DIAGNOSIS — N644 Mastodynia: Secondary | ICD-10-CM

## 2022-08-27 ENCOUNTER — Other Ambulatory Visit: Payer: Self-pay | Admitting: Cardiovascular Disease

## 2022-08-27 DIAGNOSIS — I10 Essential (primary) hypertension: Secondary | ICD-10-CM

## 2022-09-17 ENCOUNTER — Other Ambulatory Visit
Admission: RE | Admit: 2022-09-17 | Discharge: 2022-09-17 | Disposition: A | Discharge status: Home / Self Care | Payer: Medicare Other | Attending: Cardiology | Admitting: Cardiology

## 2022-09-17 DIAGNOSIS — I739 Peripheral vascular disease, unspecified: Secondary | ICD-10-CM | POA: Insufficient documentation

## 2022-09-17 DIAGNOSIS — E782 Mixed hyperlipidemia: Secondary | ICD-10-CM | POA: Diagnosis present

## 2022-09-17 DIAGNOSIS — I700 Atherosclerosis of aorta: Secondary | ICD-10-CM | POA: Diagnosis present

## 2022-09-17 DIAGNOSIS — I6523 Occlusion and stenosis of bilateral carotid arteries: Secondary | ICD-10-CM | POA: Diagnosis present

## 2022-09-17 LAB — HEPATIC FUNCTION PANEL
ALT: 14 U/L (ref 0–44)
AST: 18 U/L (ref 15–41)
Albumin: 4.3 g/dL (ref 3.5–5.0)
Alkaline Phosphatase: 62 U/L (ref 38–126)
Bilirubin, Direct: <0.1 mg/dL (ref 0.0–0.2)
Total Bilirubin: 0.7 mg/dL (ref 0.3–1.2)
Total Protein: 6.8 g/dL (ref 6.5–8.1)

## 2022-09-17 LAB — LIPID PANEL
Cholesterol: 238 mg/dL — ABNORMAL HIGH (ref 0–200)
HDL: 91 mg/dL (ref >40)
LDL Cholesterol: 130 mg/dL — ABNORMAL HIGH (ref 0–99)
Total CHOL/HDL Ratio: 2.6 RATIO
Triglycerides: 83 mg/dL (ref <150)
VLDL: 17 mg/dL (ref 0–40)

## 2022-09-17 NOTE — Progress Notes (Signed)
Please make sure she is still taking atorvastatin. If she is tolerating without side effects would recommend increasing dosing to 20 mg daily.

## 2022-09-21 ENCOUNTER — Other Ambulatory Visit: Payer: Self-pay | Admitting: *Deleted

## 2022-09-21 DIAGNOSIS — I739 Peripheral vascular disease, unspecified: Secondary | ICD-10-CM

## 2022-09-21 DIAGNOSIS — I700 Atherosclerosis of aorta: Secondary | ICD-10-CM

## 2022-09-21 DIAGNOSIS — I6523 Occlusion and stenosis of bilateral carotid arteries: Secondary | ICD-10-CM

## 2022-09-21 DIAGNOSIS — E782 Mixed hyperlipidemia: Secondary | ICD-10-CM

## 2022-09-21 MED ORDER — ATORVASTATIN CALCIUM 20 MG PO TABS: 20.0000 mg | ORAL_TABLET | Freq: Every day | ORAL | 3 refills | Status: DC

## 2022-09-24 ENCOUNTER — Telehealth: Payer: Self-pay | Admitting: *Deleted

## 2022-09-24 NOTE — Telephone Encounter (Signed)
-----   Message from Gerrie Nordmann, NP sent at 09/17/2022  5:31 PM EST ----- Please make sure she is still taking atorvastatin. If she is tolerating without side effects would recommend increasing dosing to 20 mg daily.

## 2022-09-24 NOTE — Telephone Encounter (Signed)
Patient did receive results and recommendations. No further needs at this time.

## 2022-10-17 NOTE — Progress Notes (Unsigned)
Cardiology Clinic Note   Patient Name: Melinda Le Date of Encounter: 10/18/2022  Primary Care Provider:  Gladstone Lighter, MD Primary Cardiologist:  Ida Rogue, MD  Patient Profile    81 year old female with a history of atypical chest pain and normal coronary arteries by catheterization in 2016, hypertension, hyperlipidemia, chron and chronic dizziness, who presents today for follow-up.  Past Medical History    Past Medical History:  Diagnosis Date   Atypical chest pain    a. 2009 reportedly nl cath; b. 02/2015 Nl MV; c. 07/2015 Cath: nl cors [facility]).   Crohn's ileitis, without complications ([place]) 05/14/9389   Dizziness    a. 05/2019 Carotid U/S: Nl RICA. LICA 3-00%. Antegrade vertebral flow; b. 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 Apparent high-grade focal stenosis of petrous RICA and paraclinoid LICA. May be exagerrated by artifact.   Heart murmur    History of echocardiogram    a. 10/2016 Echo: EF 65%, no rwma, Gr1 DD. Triv to Mild MR, Triv TR.   Hypertension    Melanoma ([place]) 1998   Resected at [facility]   Stroke [facility])    a. 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   Past Surgical History:  Procedure Laterality Date   BASAL CELL CARCINOMA EXCISION     CARDIAC CATHETERIZATION     Pt doesn't recall date/no stents    Allergies  Allergies  Allergen Reactions   Penicillins Hives    History of Present Illness    Melinda Le is a 81 year old female with previously mentioned past medical history of atypical chest pain with normal coronary arteries by left heart catheterization completed in 2016, hypertension, hyperlipidemia, Crohn's disease, melanoma, and chronic dizziness who was found to have a remote stroke on recent MRI.  Her dizziness dates back to May 2020 where she had undergone extensive neurological evaluation without clear cause being identified.  There was concern for prior stroke with significant  intracranial and carotid disease.  However further workup at [facility] did not confirm the latter.  She was last seen in clinic 06/17/2022 following up on recent complaints of shortness of breath and previous outpatient echocardiogram that she had had which revealed an LVEF of 60-65%, no regional wall motion abnormalities, G2 DD, and mild mitral regurgitation.  At that time she was started on Furosemide 20 mg daily.  She stated that her shortness of breath was improving and she continued to have some postnasal drip but after starting back on her antihistamine send the symptoms have resided.  Her cholesterol medicine was also changed from Crestor to atorvastatin with repeat lipid and LFTs ordered in 3 months after starting the new medication.  Her LDL was found to be 130 with a total cholesterol of 238.  At that time she was encouraged to increase the atorvastatin to 20 mg daily.   She returns to clinic today accompanied by her husband with various questions.  She states that unfortunately her cholesterol is elevated as well as her LDL is increased back to 130 she has stopped taking the atorvastatin that she was previously prescribed as she states that it causes her to have hip pain and hand pain.  She states that she understands that she does have a large quantity of arthritis but this had exacerbated her symptoms.  She stated since starting on furosemide she has also had some numbness and tingling that she has noticed in her thumb and index finger on her right and  left hand but the discomfort she was having between her shoulder blades has dissipated.  She has also stopped taking her aspirin as she cannot remember exactly why she was taking it who placed her on at that time.  She is  concerned today about her blood pressure and continues to take blood pressures at home and keeps a log. She currently takes 10 mg of amlodipine, she is concerned may be too much.  Blood pressure log at home shows blood pressures running  789-381'O systolic over 17-51'W diastolic.  She denies any hospitalization or visits to the emergency department.  Home Medications    Current Outpatient Medications  Medication Sig Dispense Refill   acetaminophen (TYLENOL) 650 MG CR tablet Take 650 mg by mouth daily.     atorvastatin (LIPITOR) 20 MG tablet Take 1 tablet (20 mg total) by mouth daily. 90 tablet 3   BABY ASPIRIN PO Take 81 mg by mouth daily. occasionally     cyanocobalamin 1000 MCG tablet Take 1,000 mcg by mouth daily.     ezetimibe (ZETIA) 10 MG tablet Take 1 tablet (10 mg total) by mouth daily. 90 tablet 3   fexofenadine (ALLEGRA) 180 MG tablet Take 180 mg by mouth daily.     folic acid (FOLVITE) 1 MG tablet Take 1 mg by mouth daily.     KRILL OIL PO Take 1 tablet by mouth daily at 12 noon.     lansoprazole (PREVACID) 15 MG capsule Take 15 mg by mouth daily as needed.     Magnesium Glycinate 665 MG CAPS Take by mouth in the morning and at bedtime.     metoprolol succinate (TOPROL-XL) 25 MG 24 hr tablet Take 0.5 tablets (12.5 mg total) by mouth daily. Take with or immediately following a meal. 45 tablet 3   naproxen sodium (ALEVE) 220 MG tablet Take 220 mg by mouth daily.     ondansetron (ZOFRAN ODT) 4 MG disintegrating tablet Take 1 tablet (4 mg total) by mouth every 8 (eight) hours as needed for nausea or vomiting. 30 tablet 1   amLODipine (NORVASC) 5 MG tablet Take 1 tablet (5 mg total) by mouth 2 (two) times daily. 180 tablet 3   cephALEXin (KEFLEX) 500 MG capsule Take 1 capsule (500 mg total) by mouth 2 (two) times daily. (Patient not taking: Reported on 06/17/2022) 14 capsule 0   cyanocobalamin (VITAMIN B12) 1000 MCG/ML injection Inject 1,000 mcg into the muscle every 30 (thirty) days. (Patient not taking: Reported on 10/18/2022)     furosemide (LASIX) 20 MG tablet Take 1 tablet (20 mg total) by mouth 3 (three) times a week. 36 tablet 3   No current facility-administered medications for this visit.     Family History     Family History  Problem Relation Age of Onset   Heart Problems Mother    Breast cancer Neg Hx    She indicated that her mother is deceased. She indicated that her father is deceased. She indicated that the status of her neg hx is unknown.  Social History    Social History   Socioeconomic History   Marital status: Married    Spouse name: Elenore Rota   Number of children: 3   Years of education: Not on file   Highest education level: Not on file  Occupational History   Not on file  Tobacco Use   Smoking status: Never   Smokeless tobacco: Never  Vaping Use   Vaping Use: Never used  Substance and Sexual  Activity   Alcohol use: Yes    Alcohol/week: 1.0 standard drink of alcohol    Types: 1 Glasses of wine per week    Comment: occasional   Drug use: Never   Sexual activity: Not Currently  Other Topics Concern   Not on file  Social History Narrative   Not on file   Social Determinants of Health   Financial Resource Strain: Low Risk  (04/13/2019)   Overall Financial Resource Strain (CARDIA)    Difficulty of Paying Living Expenses: Not hard at all  Food Insecurity: No Food Insecurity (04/13/2019)   Hunger Vital Sign    Worried About Running Out of Food in the Last Year: Never true    Ran Out of Food in the Last Year: Never true  Transportation Needs: No Transportation Needs (04/13/2019)   PRAPARE - Hydrologist (Medical): No    Lack of Transportation (Non-Medical): No  Physical Activity: Not on file  Stress: Not on file  Social Connections: Not on file  Intimate Partner Violence: Not on file     Review of Systems    General:  No chills, fever, night sweats or weight changes.  Endorses fatigue Cardiovascular:  No chest pain, endorses occasional dyspnea on exertion, endorses occasional peripheral edema, orthopnea, palpitations, paroxysmal nocturnal dyspnea. Dermatological: No rash, lesions/masses Respiratory: No cough, endorses occasional  dyspnea Urologic: No hematuria, dysuria Musculoskeletal: Endorses hip pain and pain from arthritis to her hands Abdominal:   No nausea, vomiting, diarrhea, bright red blood per rectum, melena, or hematemesis Neurologic:  No visual changes, wkns, changes in mental status. All other systems reviewed and are otherwise negative except as noted above.   Physical Exam    VS:  BP (!) 164/70 (BP Location: Left Arm, Patient Position: Sitting, Cuff Size: Normal)   Pulse 69   Ht 5' 1"  (1.549 m)   Wt 141 lb 6.4 oz (64.1 kg)   SpO2 99%   BMI 26.72 kg/m  , BMI Body mass index is 26.72 kg/m.     GEN: Well nourished, well developed, in no acute distress. HEENT: normal. Neck: Supple, no JVD, carotid bruits, or masses. Cardiac: RRR, no murmurs, rubs, or gallops. No clubbing, cyanosis, trace pretibial edema.  Radials 2+/PT 2+ and equal bilaterally.  Respiratory:  Respirations regular and unlabored, clear to auscultation bilaterally. GI: Soft, nontender, nondistended, BS + x 4. MS: no deformity or atrophy. Skin: warm and dry, no rash. Neuro:  Strength and sensation are intact. Psych: Normal affect.  Accessory Clinical Findings    ECG personally reviewed by me today-no new tracings were completed today  Lab Results  Component Value Date   WBC 8.8 06/04/2022   HGB 12.5 06/04/2022   HCT 37.3 06/04/2022   MCV 94.9 06/04/2022   PLT 287 06/04/2022   Lab Results  Component Value Date   CREATININE 0.98 06/04/2022   BUN 14 06/04/2022   NA 133 (L) 06/04/2022   K 3.9 06/04/2022   CL 100 06/04/2022   CO2 25 06/04/2022   Lab Results  Component Value Date   ALT 14 09/17/2022   AST 18 09/17/2022   ALKPHOS 62 09/17/2022   BILITOT 0.7 09/17/2022   Lab Results  Component Value Date   CHOL 238 (H) 09/17/2022   HDL 91 09/17/2022   LDLCALC 130 (H) 09/17/2022   TRIG 83 09/17/2022   CHOLHDL 2.6 09/17/2022    Lab Results  Component Value Date   HGBA1C 5.5 04/13/2019  Assessment & Plan    1.  Continued dizziness that has persisted over the last 3 years and has been evaluated by neurology extensively was unfounded.  She did previously have an MRI of the brain which revealed stroke.  She states that her dizziness she thinks has worsened since taking furosemide daily as we have decreased her furosemide to 3 days a week on Monday Wednesday and Friday to see if that helps with her symptoms.  2.  Hypertension with blood pressure today 160/70 she is continue to keep blood pressure log at home which revealed systolic blood pressures of 118-130/60-70.  She is concerned that her blood pressure is getting down and the lower 100s and thinks that her medication is too high as she is aging.  She has been advised to continue on her furosemide Monday Wednesday Friday her Toprol-XL half a tablet daily, change amlodipine to 5 mg twice daily.  She has been encouraged to continue to keep a blood pressure log at home.  3.  Mixed hyperlipidemia with an LDL of 130 on her most recent lipid panel.  Previously rosuvastatin had been changed to atorvastatin.  Patient states that she has been googling her medications on the Internet and found that furosemide causes elevated cholesterol.  She did stop taking her statin medication due to hip pain.  After further discussion it is likely her cholesterol is familial but she is willing to try Zetia 10 mg daily before looking at bempedoic acid or injectable medications.  She will need to recheck lipid and LFT in 3 months.  4.  Chronic shortness of breath that remains stable.  Previous echocardiogram revealed LVEF of 60 to 65%, no regional wall motion abnormalities, G2 DD, mildly elevated pulmonary systolic pressure, and mild mitral regurgitation.  She will continue with the Lasix on Monday Wednesday and Friday.  5.  Disposition patient return to clinic to see Dr. Rockey Situ or APP in 3 months or sooner if needed.    Jennavecia Schwier, NP 10/18/2022, 11:52 AM

## 2022-10-18 ENCOUNTER — Ambulatory Visit: Payer: Medicare Other | Attending: Cardiology | Admitting: Cardiology

## 2022-10-18 ENCOUNTER — Encounter: Payer: Self-pay | Admitting: Cardiology

## 2022-10-18 VITALS — BP 164/70 | HR 69 | Ht 61.0 in | Wt 141.4 lb

## 2022-10-18 DIAGNOSIS — I10 Essential (primary) hypertension: Secondary | ICD-10-CM | POA: Insufficient documentation

## 2022-10-18 DIAGNOSIS — E782 Mixed hyperlipidemia: Secondary | ICD-10-CM | POA: Insufficient documentation

## 2022-10-18 DIAGNOSIS — R0602 Shortness of breath: Secondary | ICD-10-CM | POA: Diagnosis present

## 2022-10-18 DIAGNOSIS — R42 Dizziness and giddiness: Secondary | ICD-10-CM | POA: Diagnosis not present

## 2022-10-18 DIAGNOSIS — Z79899 Other long term (current) drug therapy: Secondary | ICD-10-CM | POA: Diagnosis not present

## 2022-10-18 MED ORDER — AMLODIPINE BESYLATE 5 MG PO TABS: 5.0000 mg | ORAL_TABLET | Freq: Two times a day (BID) | ORAL | 3 refills | Status: DC

## 2022-10-18 MED ORDER — EZETIMIBE 10 MG PO TABS: 10.0000 mg | ORAL_TABLET | Freq: Every day | ORAL | 3 refills | Status: DC

## 2022-10-18 MED ORDER — FUROSEMIDE 20 MG PO TABS: 20.0000 mg | ORAL_TABLET | ORAL | 3 refills | Status: DC

## 2022-10-18 NOTE — Patient Instructions (Signed)
Medication Instructions:   CHANGE: Amlodipine to 5 mg by mouth twice a day  DECREASE: Lasix to 20 mg by mouth 3 times a week  START: Zetia 10 mg by mouth daily  *If you need a refill on your cardiac medications before your next appointment, please call your pharmacy*   Lab Work: Your provider would like for you to return in March, 2024 to have the following labs drawn: (Lipid, LFT).   Please go to the [facility] entrance and check in at the front desk.  You do not need an appointment.  They are open from 7am-6 pm.  You will need to be fasting.  If you have labs (blood work) drawn today and your tests are completely normal, you will receive your results only by: [place] (if you have MyChart) OR A paper copy in the mail If you have any lab test that is abnormal or we need to change your treatment, we will call you to review the results.   Testing/Procedures: None ordered today   Follow-Up: At [facility], you and your health needs are our priority.  As part of our continuing mission to provide you with exceptional heart care, we have created designated Provider Care Teams.  These Care Teams include your primary Cardiologist (physician) and Advanced Practice Providers (APPs -  Physician Assistants and Nurse Practitioners) who all work together to provide you with the care you need, when you need it.  We recommend signing up for the patient portal called "MyChart".  Sign up information is provided on this After Visit Summary.  MyChart is used to connect with patients for Virtual Visits (Telemedicine).  Patients are able to view lab/test results, encounter notes, upcoming appointments, etc.  Non-urgent messages can be sent to your provider as well.   To learn more about what you can do with MyChart, go to [email or web address].    Your next appointment:   3 month(s)  The format for your next appointment:   In Person  Provider:   Ida Rogue, MD

## 2022-10-19 DIAGNOSIS — L719 Rosacea, unspecified: Secondary | ICD-10-CM | POA: Insufficient documentation

## 2022-10-19 DIAGNOSIS — Z961 Presence of intraocular lens: Secondary | ICD-10-CM | POA: Insufficient documentation

## 2022-12-23 DIAGNOSIS — M47816 Spondylosis without myelopathy or radiculopathy, lumbar region: Secondary | ICD-10-CM | POA: Insufficient documentation

## 2022-12-23 DIAGNOSIS — M533 Sacrococcygeal disorders, not elsewhere classified: Secondary | ICD-10-CM | POA: Insufficient documentation

## 2022-12-23 DIAGNOSIS — M1612 Unilateral primary osteoarthritis, left hip: Secondary | ICD-10-CM | POA: Insufficient documentation

## 2023-01-14 ENCOUNTER — Other Ambulatory Visit
Admission: RE | Admit: 2023-01-14 | Discharge: 2023-01-14 | Disposition: A | Discharge status: Home / Self Care | Payer: Medicare Other | Attending: Cardiology | Admitting: Cardiology

## 2023-01-14 DIAGNOSIS — E782 Mixed hyperlipidemia: Secondary | ICD-10-CM

## 2023-01-14 DIAGNOSIS — Z79899 Other long term (current) drug therapy: Secondary | ICD-10-CM | POA: Insufficient documentation

## 2023-01-14 DIAGNOSIS — I6523 Occlusion and stenosis of bilateral carotid arteries: Secondary | ICD-10-CM

## 2023-01-14 DIAGNOSIS — I739 Peripheral vascular disease, unspecified: Secondary | ICD-10-CM

## 2023-01-14 DIAGNOSIS — I700 Atherosclerosis of aorta: Secondary | ICD-10-CM

## 2023-01-14 LAB — HEPATIC FUNCTION PANEL
ALT: 15 U/L (ref 0–44)
AST: 22 U/L (ref 15–41)
Albumin: 4.5 g/dL (ref 3.5–5.0)
Alkaline Phosphatase: 67 U/L (ref 38–126)
Bilirubin, Direct: <0.1 mg/dL (ref 0.0–0.2)
Total Bilirubin: 0.9 mg/dL (ref 0.3–1.2)
Total Protein: 7 g/dL (ref 6.5–8.1)

## 2023-01-14 LAB — LIPID PANEL
Cholesterol: 265 mg/dL — ABNORMAL HIGH (ref 0–200)
HDL: 102 mg/dL (ref >40)
LDL Cholesterol: 147 mg/dL — ABNORMAL HIGH (ref 0–99)
Total CHOL/HDL Ratio: 2.6 RATIO
Triglycerides: 81 mg/dL (ref <150)
VLDL: 16 mg/dL (ref 0–40)

## 2023-01-17 ENCOUNTER — Telehealth: Payer: Self-pay | Admitting: *Deleted

## 2023-01-17 NOTE — Telephone Encounter (Signed)
Spoke with patient and she never started it. She has had episodes where she is not able to walk. Reviewed provider recommendations and she refused making any changes at this time. She has upcoming appointments with her Ortho doctor and Dr. Rockey Situ. She would like to speak with them before making any new changes. Advised to let us know if we can assist with anything. She was appreciative for the call with no further questions

## 2023-01-17 NOTE — Telephone Encounter (Signed)
-----   Message from Gerrie Nordmann, NP sent at 01/17/2023  3:50 PM EDT ----- Total cholesterol and LDL have worsened since last checks. Previously atorvastatin was increased to 20 mg daily. Was this dose tolerated? If not recommend starting injectables like Repatha 140mg  every 2 weeks. There repeat lipid panel in 3 months.

## 2023-01-19 ENCOUNTER — Other Ambulatory Visit: Payer: Self-pay

## 2023-01-19 ENCOUNTER — Ambulatory Visit: Payer: Medicare Other | Attending: Cardiovascular Disease | Admitting: Cardiovascular Disease

## 2023-01-19 ENCOUNTER — Encounter: Payer: Self-pay | Admitting: Cardiovascular Disease

## 2023-01-19 VITALS — BP 150/60 | HR 80 | Ht 61.0 in | Wt 140.4 lb

## 2023-01-19 DIAGNOSIS — I700 Atherosclerosis of aorta: Secondary | ICD-10-CM

## 2023-01-19 DIAGNOSIS — T466X5A Adverse effect of antihyperlipidemic and antiarteriosclerotic drugs, initial encounter: Secondary | ICD-10-CM | POA: Diagnosis present

## 2023-01-19 DIAGNOSIS — I10 Essential (primary) hypertension: Secondary | ICD-10-CM

## 2023-01-19 DIAGNOSIS — I6523 Occlusion and stenosis of bilateral carotid arteries: Secondary | ICD-10-CM

## 2023-01-19 DIAGNOSIS — R0602 Shortness of breath: Secondary | ICD-10-CM | POA: Diagnosis present

## 2023-01-19 DIAGNOSIS — M791 Myalgia, unspecified site: Secondary | ICD-10-CM

## 2023-01-19 DIAGNOSIS — E782 Mixed hyperlipidemia: Secondary | ICD-10-CM

## 2023-01-19 DIAGNOSIS — I739 Peripheral vascular disease, unspecified: Secondary | ICD-10-CM | POA: Diagnosis present

## 2023-01-19 DIAGNOSIS — R000 Tachycardia, unspecified: Secondary | ICD-10-CM

## 2023-01-19 DIAGNOSIS — R0789 Other chest pain: Secondary | ICD-10-CM

## 2023-01-19 MED ORDER — ASPIRIN 81 MG PO TBEC: 81.0000 mg | DELAYED_RELEASE_TABLET | Freq: Every day | ORAL | 2 refills | Status: AC

## 2023-01-19 MED ORDER — ROSUVASTATIN CALCIUM 5 MG PO TABS: 5.0000 mg | ORAL_TABLET | ORAL | 2 refills | Status: DC

## 2023-01-19 MED ORDER — ROSUVASTATIN CALCIUM 5 MG PO TABS
5.0000 mg | ORAL_TABLET | ORAL | 1 refills | Status: DC
Start: 2023-01-19 — End: 2024-05-03

## 2023-01-19 MED ORDER — EZETIMIBE 10 MG PO TABS: 10.0000 mg | ORAL_TABLET | Freq: Every day | ORAL | 2 refills | Status: DC

## 2023-01-19 NOTE — Progress Notes (Signed)
Cardiology Office Note  Date:  01/19/2023   ID:  Melinda Le, DOB 05/19/1941, MRN [id number]  PCP:  Melinda Lighter, MD   Chief Complaint  Patient presents with   3 month follow up     Patient c/o shortness of breath with walking a short distance. Medications reviewed by the patient verbally.      HPI:  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 No acute intracranial large vessel occlusion or high grade stenosis.  2. Previously described stenosis of the bilateral ICAs is not seen on this  exam and is likely artifactual in prior MRI.  Who presents for follow-up of her chronic dizziness, arrhythmia, aortic atherosclerosis, hyperlipidemia  Last seen in clinic by myself 9/22 Seen by one of our providers December 2023  Prior symptoms of shortness of breath in 2023 Echo discussed on today's visit LVEF of 60-65%, no regional wall motion abnormalities, G2 DD, and mild mitral regurgitation  Was started on Lasix 20 daily, improving shortness of breath Crestor was changed to Lipitor  In follow-up today concerned about her medications Stopped the Lipitor, Crestor, Zetia out of concern for side effects, back pain Reading on [employer] concerning potential for side effects She is concerned about Lasix causing her numbness in her hands, trigger finger Feels better taking Lasix 3 days a week  Chronic dizziness, previously seen by neurology, old stroke  Scheduled to see pain clinic  No regular exercise program  CT scan chest reviewed showing mild to moderate aortic arch atherosclerosis, minimal in the descending aorta, mild coronary calcification  Lab work reviewed A1C 5.8 Total chol 236, LDL  111  CT neck 2021 Cervical Spine: Moderate multilevel degenerative changes without severe  spinal canal stenosis.   EKG personally reviewed by myself on todays visit NSR rate 80 bpm no significant ST-T wave changes  CT head at [facility] No significant carotid disease Does not correlate with MRI 2020  Stress test 2020 Pharmacological myocardial perfusion imaging study with no significant  ischemia Normal wall motion, EF estimated at 83% No EKG changes concerning for ischemia at peak stress or in recovery. CT attenuation correction images with mild aortic atherosclerosis, no notable coronary calcification Low risk scan  aortic atherosclerosis seen on CT scan 2019 confirmed on stress test imaging  On last clinic visit reported having chronic dizziness, worse when she moves her head forward, such as bending her head down  Seen by our office October 2020, referred to vascular surgery Had appointment with vascular surgery October 10, 2019  Also seen by neurology at [facility]: recommended medical management  Prior testing reviewed Stress test 08/10/2019 Pharmacological myocardial perfusion imaging study with no significant  ischemia Normal wall motion, EF estimated at 83% No EKG changes concerning for ischemia at peak stress or in recovery. 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 2 Ventricular Tachycardia  runs occurred, very short, no symptoms 9 Supraventricular Tachycardia /atrial tachycardia runs occurred, very short, no symptoms No atrial fibrillation  Prior cervical spine x-ray  Xray:  Multilevel cervical spondylosis, worst at C4-5 and C5-6.  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  No MRI was performed; give meclizine and valium.    Meclizine did not help   PMH:   has a past medical history of Atypical chest pain, Crohn's ileitis, without complications ([place]) ([id number]), Dizziness, Heart murmur, History of echocardiogram, Hypertension, Melanoma ([place]) (1998), and Stroke ([place]).  PSH:    Past Surgical History:  Procedure Laterality Date   BASAL CELL CARCINOMA EXCISION     CARDIAC CATHETERIZATION     Pt doesn't recall date/no stents    Current Outpatient Medications  Medication Sig Dispense Refill   acetaminophen (TYLENOL) 650 MG CR tablet Take 650 mg by mouth daily.     amLODipine (NORVASC) 5 MG tablet Take 1 tablet (5 mg total) by mouth 2 (two) times daily. 180 tablet 3   aspirin EC 81 MG tablet Take 1 tablet (81 mg total) by mouth daily. Swallow whole. 90 tablet 2   cyanocobalamin (VITAMIN B12) 1000 MCG/ML injection Inject 1,000 mcg into the muscle every 30 (thirty) days.     cyanocobalamin 1000 MCG tablet Take 1,000 mcg by mouth daily.     ezetimibe (ZETIA) 10 MG tablet Take 1 tablet (10 mg total) by mouth daily. 90 tablet 2   fexofenadine (ALLEGRA) 180 MG tablet Take 180 mg by mouth daily.     folic acid (FOLVITE) 1 MG tablet Take 1 mg by mouth daily.     furosemide (LASIX) 20 MG tablet Take 1 tablet (20 mg total) by mouth 3 (three) times a week. 36 tablet 3   KRILL OIL PO Take 1 tablet by mouth daily at 12 noon.     lansoprazole (PREVACID) 15 MG capsule Take 15 mg by mouth daily as needed.     Magnesium Glycinate 665 MG CAPS Take by mouth in the morning and at bedtime.      metoprolol succinate (TOPROL-XL) 25 MG 24 hr tablet Take 0.5 tablets (12.5 mg total) by mouth daily. Take with or immediately following a meal. 45 tablet 3   naproxen sodium (ALEVE) 220 MG tablet Take 220 mg by mouth daily.     ondansetron (ZOFRAN ODT) 4 MG disintegrating tablet Take 1 tablet (4 mg total) by mouth every 8 (eight) hours as needed for nausea or vomiting. 30 tablet 1   rosuvastatin (CRESTOR) 5 MG tablet Take 1 tablet (5 mg total) by mouth every Monday, Wednesday, and Friday. 12 tablet 1   No current facility-administered medications for this visit.    Allergies:   Adrenal cortex extract and Penicillins   Social History:  The patient  reports that she has never smoked. She has never used smokeless tobacco. She reports current alcohol use of about 1.0 standard drink of alcohol per week. She reports that she does not use drugs.   Family History:   family history includes Heart Problems in her mother.    Review of Systems: Review of Systems  Constitutional: Negative.   Respiratory: Negative.    Cardiovascular: Negative.   Gastrointestinal:  Negative.   Musculoskeletal: Negative.   Neurological:  Positive for dizziness.  Psychiatric/Behavioral: Negative.    All other systems reviewed and are negative.   PHYSICAL EXAM: VS:  BP (!) 160/60 (BP Location: Left Arm, Patient Position: Sitting, Cuff Size: Normal)   Pulse 80   Ht '5\' 1"'$  (1.549 m)   Wt 140 lb 6 oz (63.7 kg)   SpO2 98%   BMI 26.52 kg/m  , BMI Body mass index is 26.52 kg/m. Constitutional:  oriented to person, place, and time. No distress.  HENT:  Head: Grossly normal Eyes:  no discharge. No scleral icterus.  Neck: No JVD, no carotid bruits  Cardiovascular: Regular rate and rhythm, no murmurs appreciated Pulmonary/Chest: Clear to auscultation bilaterally, no wheezes or rails Abdominal: Soft.  no distension.  no tenderness.  Musculoskeletal: Normal range of motion Neurological:  normal muscle tone.  Coordination normal. No atrophy Skin: Skin warm and dry Psychiatric: normal affect, pleasant  Recent Labs: 06/04/2022: B Natriuretic Peptide 141.2; BUN 14; Creatinine, Ser 0.98; Hemoglobin 12.5; Platelets 287; Potassium 3.9; Sodium 133 01/14/2023: ALT 15    Lipid Panel Lab Results  Component Value Date   CHOL 265 (H) 01/14/2023   HDL 102 01/14/2023   LDLCALC 147 (H) 01/14/2023   TRIG 81 01/14/2023    Wt Readings from Last 3 Encounters:  01/19/23 140 lb 6 oz (63.7 kg)  10/18/22 141 lb 6.4 oz (64.1 kg)  06/17/22 142 lb 6.4 oz (64.6 kg)    ASSESSMENT AND PLAN:    ICD-10-CM   1. Aortic atherosclerosis (HCC)  I70.0 EKG 12-Lead    2. Bilateral carotid artery stenosis  I65.23 EKG 12-Lead    3. PAD (peripheral artery disease) (HCC)  I73.9 EKG 12-Lead    4. Essential hypertension  I10 EKG 12-Lead    5. Tachycardia  R00.0     6. Atypical chest pain  R07.89     7. SOB (shortness of breath)  R06.02     8. Mixed hyperlipidemia  E78.2     9. Essential (primary) hypertension  I10     10. 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, seen 03/2019 on CT and MRI CT with no significant carotid disease Recommend exercise program  CVA Carotid disease, minimal dx Take asa 81 mg daily given aortic atherosclerosis, mild to moderate in the arch CT scan chest pulled up and images reviewed  Hyperlipidemia Reluctant to be on Crestor Zetia, had stopped on her own Long discussion concerning CT scan findings showing mild to moderate aortic atherosclerosis in the arch She does not want Repatha after reading potential for side effects Will retry Zetia or Crestor but does not want to take both  Essential hypertension Blood pressure elevated on today's visit She reports blood pressure well-controlled at home Recommended she continue to monitor numbers closely  Diastolic CHF Echocardiogram with mildly elevated right heart  pressures Symptoms improved on Lasix every other day up to daily She is concerned about potential for side effects Recommend Lasix 3 times a week with potassium   Total encounter time more than 40 minutes  Greater than 50% was spent in counseling and coordination of care with the patient   Orders Placed This Encounter  Procedures   EKG 12-Lead     Signed, Esmond Plants, M.D., Ph.D. 01/19/2023  [place], [place]

## 2023-01-19 NOTE — Patient Instructions (Addendum)
Medication Instructions:  START rosuvastatin 5 mg by mouth M-W-F RESTART aspirin '81mg'$  (OTC) by mouth daily RESTART Zetia 10 mg by mouth daily  If you need a refill on your cardiac medications before your next appointment, please call your pharmacy.   Lab work: No new labs needed  Testing/Procedures: No new testing needed  Follow-Up: At [facility], you and your health needs are our priority.  As part of our continuing mission to provide you with exceptional heart care, we have created designated Provider Care Teams.  These Care Teams include your primary Cardiologist (physician) and Advanced Practice Providers (APPs -  Physician Assistants and Nurse Practitioners) who all work together to provide you with the care you need, when you need it.  You will need a follow up appointment in 12 months  Providers on your designated Care Team:   Melinda Crutch, NP  COVID-19 Vaccine Information can be found at: [email or web address] For questions related to vaccine distribution or appointments, please email [email or web address] or call [phone].

## 2023-03-13 ENCOUNTER — Other Ambulatory Visit: Payer: Self-pay | Admitting: Internal Medicine

## 2023-03-15 ENCOUNTER — Other Ambulatory Visit: Payer: Self-pay | Admitting: Internal Medicine

## 2023-06-09 ENCOUNTER — Other Ambulatory Visit: Payer: Self-pay | Admitting: Internal Medicine

## 2023-06-09 DIAGNOSIS — Z1231 Encounter for screening mammogram for malignant neoplasm of breast: Secondary | ICD-10-CM

## 2023-07-01 ENCOUNTER — Ambulatory Visit
Admission: RE | Admit: 2023-07-01 | Discharge: 2023-07-01 | Disposition: A | Discharge status: Home / Self Care | Payer: Medicare Other | Source: Ambulatory Visit | Attending: Internal Medicine | Admitting: Internal Medicine

## 2023-07-01 DIAGNOSIS — Z1231 Encounter for screening mammogram for malignant neoplasm of breast: Secondary | ICD-10-CM | POA: Insufficient documentation

## 2023-07-06 ENCOUNTER — Other Ambulatory Visit: Payer: Self-pay | Admitting: Cardiology

## 2023-09-29 ENCOUNTER — Other Ambulatory Visit: Payer: Self-pay | Admitting: Cardiovascular Disease

## 2023-12-09 ENCOUNTER — Other Ambulatory Visit: Payer: Self-pay | Admitting: Internal Medicine

## 2023-12-09 DIAGNOSIS — M5416 Radiculopathy, lumbar region: Secondary | ICD-10-CM

## 2023-12-15 ENCOUNTER — Ambulatory Visit
Admission: RE | Admit: 2023-12-15 | Discharge: 2023-12-15 | Disposition: A | Discharge status: Home / Self Care | Payer: Medicare Other | Source: Ambulatory Visit | Attending: Internal Medicine | Admitting: Internal Medicine

## 2023-12-15 DIAGNOSIS — M5416 Radiculopathy, lumbar region: Secondary | ICD-10-CM | POA: Insufficient documentation

## 2023-12-26 ENCOUNTER — Other Ambulatory Visit: Payer: Self-pay | Admitting: Cardiovascular Disease

## 2023-12-27 NOTE — Telephone Encounter (Signed)
 Hi,  Will you please outreach patient to schedule OD follow up appt.  Thank you,

## 2023-12-28 ENCOUNTER — Telehealth: Payer: Self-pay | Admitting: Cardiovascular Disease

## 2023-12-28 NOTE — Telephone Encounter (Signed)
 Left voicemail, pt needs appt scheduled from recall

## 2023-12-29 ENCOUNTER — Telehealth: Payer: Self-pay | Admitting: Cardiovascular Disease

## 2023-12-29 NOTE — Telephone Encounter (Signed)
 Unable to leave voicemail, no answer when called. Called patient to schedule follow up appointment from recall.

## 2024-01-02 ENCOUNTER — Telehealth: Payer: Self-pay | Admitting: Cardiovascular Disease

## 2024-01-02 NOTE — Telephone Encounter (Signed)
 Unable to leave voicemail, pt needs appt scheduled from recall for med refill

## 2024-01-02 NOTE — Telephone Encounter (Signed)
 Unable to leave voicemail.

## 2024-01-04 ENCOUNTER — Encounter: Payer: Self-pay | Admitting: Cardiovascular Disease

## 2024-01-04 NOTE — Telephone Encounter (Signed)
Unable to reach letter sent via mail.

## 2024-01-30 ENCOUNTER — Other Ambulatory Visit: Payer: Self-pay | Admitting: Family Medicine

## 2024-01-30 DIAGNOSIS — N644 Mastodynia: Secondary | ICD-10-CM

## 2024-01-30 DIAGNOSIS — N6452 Nipple discharge: Secondary | ICD-10-CM

## 2024-02-08 ENCOUNTER — Ambulatory Visit
Admission: RE | Admit: 2024-02-08 | Discharge: 2024-02-08 | Disposition: A | Discharge status: Home / Self Care | Source: Ambulatory Visit | Attending: Family Medicine | Admitting: Family Medicine

## 2024-02-08 DIAGNOSIS — N644 Mastodynia: Secondary | ICD-10-CM

## 2024-02-08 DIAGNOSIS — N6452 Nipple discharge: Secondary | ICD-10-CM | POA: Insufficient documentation

## 2024-02-13 NOTE — Progress Notes (Unsigned)
 Cardiology Office Note:  .   Date:  02/14/2024  ID:  Melinda Le, DOB 1941-08-03, MRN [id number] PCP: Enid Baas, MD  [facility] Providers Cardiologist:  Julien Nordmann, MD    History of Present Illness: .   Melinda Le is a 83 y.o. female with a past medical history of atypical chest pain, (normal coronary arteries by catheterization in 2016), hypertension, hyperlipidemia, chronic dizziness, who presents today for follow-up.   With a history of atypical chest pain with normal coronary arteries by left heart catheterization completed in 2016, hypertension, hyperlipidemia, Crohn's disease, melanoma, and chronic dizziness who was found to have a remote stroke on recent MRI.  Her dizziness dates back to May 2020 where she had undergone extensive neurological evaluation without clear cause being identified.  There was concern for prior stroke with significant intracranial and carotid disease.  However further workup at [facility] did not confirm the latter.  She underwent stress testing in 2020 [facility] which showed no evidence of significant ischemia, normal wall motion, with an EF estimated at 83%.  Presented to clinic in 06/2022 with complaints of shortness of breath previous outpatient echocardiogram revealed an LVEF of 60 to 65%, no RWMA, G2 DD, mild MR.  She was started on furosemide 20 mg daily.    She was last seen in clinic 3/38/24 by Dr. Mariah Milling.  She continued to have complaints of shortness of breath with walking short distances.  She was started on Lasix 20 mg daily with improving shortness of breath at her previous visit.  She was concerned about her medications on return and she had stopped taking Lipitor, and said he had a concern for side effects of back pain she was concerned about Lasix because of the numbness in her hands and trigger fingers she feels better taking Lasix 3 days a week.  She had been scheduled to see pain clinic as well.  There were no medication  changes that were made and further testing that was ordered at that time.  She returns to clinic today accompanied by her husband.  She continues to have back pain as well as breast pain.  She states that she has had mammograms and ultrasounds that have come back negative but continues to have breast pain.  She states that she did some research on the computer and noted her diuretic could be causing some of her discomfort.  She reports she is continued to have shortness of breath with dyspnea on exertion the most stressful thing that she does during the day would probably be just mopping the rails.  She has hardwood floors.  She states it has not changed over the last 6 months.  She says that likely is related to lack of activity is extremely active during the summer but it decreases during the wintertime.  She had previously been advised to take furosemide 3 times daily to decrease some of the symptoms that she was having and after discussion with her PCP decided to go back on the furosemide and daily intake her amlodipine dose related to her blood pressure.  Her blood pressure was over 150 she will take her amlodipine and if it was less she would hold off taking her amlodipine.  She denies any hospitalizations or visits to the emergency department.  ROS: 10 point review of system has been reviewed and considered negative except ones are listed in the HPI  Studies Reviewed: Marland Kitchen   EKG Interpretation Date/Time:  Tuesday February 14 2024 10:23:28 EDT  Ventricular Rate:  68 PR Interval:  160 QRS Duration:  80 QT Interval:  394 QTC Calculation: 418 R Axis:   1  Text Interpretation: Normal sinus rhythm Possible Left atrial enlargement When compared with ECG of 04-Jun-2022 21:05, No significant change was found Confirmed by Charlsie Quest ([id number]) on 02/14/2024 10:28:26 AM    2D echo 06/10/2022 1. Left ventricular ejection fraction, by estimation, is 60 to 65%. The  left ventricle has normal function. The left  ventricle has no regional  wall motion abnormalities. Left ventricular diastolic parameters are  consistent with Grade II diastolic  dysfunction (pseudonormalization). The average left ventricular global  longitudinal strain is -19.6 %.   2. Right ventricular systolic function is normal. The right ventricular  size is normal. There is mildly elevated pulmonary artery systolic  pressure. The estimated right ventricular systolic pressure is 41.7 mmHg.   3. The mitral valve is normal in structure. Mild mitral valve  regurgitation. No evidence of mitral stenosis.   4. The aortic valve is normal in structure. Aortic valve regurgitation is  not visualized. No aortic stenosis is present.   5. The inferior vena cava is normal in size with greater than 50%  respiratory variability, suggesting right atrial pressure of 3 mmHg.   Lexiscan MPI 08/08/2019 Pharmacological myocardial perfusion imaging study with no significant  ischemia Normal wall motion, EF estimated at 83% No EKG changes concerning for ischemia at peak stress or in recovery. CT attenuation correction images with mild aortic atherosclerosis, no notable coronary calcification Low risk scan Risk Assessment/Calculations:        Physical Exam:   VS:  BP (!) 148/96   Pulse 68   Ht 5' 0.5" (1.537 m)   Wt 142 lb 9.6 oz (64.7 kg)   SpO2 97%   BMI 27.39 kg/m    Wt Readings from Last 3 Encounters:  02/14/24 142 lb 9.6 oz (64.7 kg)  01/19/23 140 lb 6 oz (63.7 kg)  10/18/22 141 lb 6.4 oz (64.1 kg)    GEN: Well nourished, well developed in no acute distress NECK: No JVD; No carotid bruits CARDIAC: RRR, no murmurs, rubs, gallops RESPIRATORY:  Clear to auscultation without rales, wheezing or rhonchi  ABDOMEN: Soft, non-tender, non-distended EXTREMITIES: Trace pretibial edema; No deformity   ASSESSMENT AND PLAN: .   Chronic dizziness/this persistent "the last 3 to 4 years.  She has previously been evaluated by neurology extensively.   MRI of the brain revealed old lacunar 5 parietal lobe cortical infarcts seen 03/2019 on CT as well.  CT revealed no significant carotid disease.Marland Kitchen  Previously furosemide was decreased to 3 days a week to help decrease symptoms since daily dose and exacerbated her dizziness.  Once being seen by her PCP it was decided that she will increase her furosemide back to daily without exacerbation of symptoms.  EKG today revealed normal sinus rhythm with a rate of 68 with possible left atrial enlargement but no acute changes found from prior studies.  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  She is continued on aspirin 81 mg daily rosuvastatin 5 mg 3 times a week and ezetimibe 10 mg daily.  Aspirin was given for aortic atherosclerosis there was mild to moderate noted in the aortic arch on chest CT.  Chronic HFpEF with chronic shortness of breath with last LVEF of 60 to 65%, no regional wall motion abnormalities, G2 DD, mildly elevated pulmonary artery systolic pressure in the  setting of mild MR.  She is continued with chronic shortness of breath that is unchanged.  Without any change in his symptoms she is going to take her furosemide on a as needed basis to see if that might do since the symptoms that she has of breast pain and tenderness.  She will take for weight gain of 2 pounds or worsening shortness of breath.  She has been continued on Toprol-XL 25 mg daily.  With her chronic dizziness deferred initiation of SGLT2 inhibitor as well as MRA therapy with continued complaints of breast pain.  Primary hypertension with blood pressure today 148/96.  Blood pressure is typically elevated when she comes to appointments.  She did bring a log from home which shows blood pressure ranging from 130s and 140s off her current medication regimen.  She has been continued on 5 mg twice daily of amlodipine and Toprol-XL 25 mg daily.  She will now take Lasix as needed.  She has been  advised to start feeling any increase in blood pressure she may need medication change chest.  Encouraged to continue to monitor blood pressure 1 to 2 hours postmedication administration as well.  Mixed hyperlipidemia with an LDL of 107 which is an improvement from March 2024 when she was 147.  She is continued on ezetimibe 10 mg daily rosuvastatin 5 mg 3 days a week.  With improvements in numbers deferred any further medication changes today.  Patient declined Repatha after reading side effects and medication profile on the Internet.       Dispo: Patient return to clinic to see me/APP in 3 months or sooner if needed for reevaluation of symptoms.  Signed, Marcelina Mclaurin, NP

## 2024-02-14 ENCOUNTER — Ambulatory Visit: Attending: Cardiology | Admitting: Cardiology

## 2024-02-14 ENCOUNTER — Encounter: Payer: Self-pay | Admitting: Cardiology

## 2024-02-14 VITALS — BP 148/96 | HR 68 | Ht 60.5 in | Wt 142.6 lb

## 2024-02-14 DIAGNOSIS — R0602 Shortness of breath: Secondary | ICD-10-CM | POA: Insufficient documentation

## 2024-02-14 DIAGNOSIS — E782 Mixed hyperlipidemia: Secondary | ICD-10-CM | POA: Insufficient documentation

## 2024-02-14 DIAGNOSIS — I700 Atherosclerosis of aorta: Secondary | ICD-10-CM | POA: Insufficient documentation

## 2024-02-14 DIAGNOSIS — Z8673 Personal history of transient ischemic attack (TIA), and cerebral infarction without residual deficits: Secondary | ICD-10-CM | POA: Diagnosis present

## 2024-02-14 DIAGNOSIS — I5032 Chronic diastolic (congestive) heart failure: Secondary | ICD-10-CM | POA: Diagnosis present

## 2024-02-14 DIAGNOSIS — I10 Essential (primary) hypertension: Secondary | ICD-10-CM | POA: Insufficient documentation

## 2024-02-14 DIAGNOSIS — R42 Dizziness and giddiness: Secondary | ICD-10-CM | POA: Diagnosis not present

## 2024-02-14 MED ORDER — AMLODIPINE BESYLATE 5 MG PO TABS: 5.0000 mg | ORAL_TABLET | Freq: Two times a day (BID) | ORAL | 3 refills | Status: AC

## 2024-02-14 MED ORDER — FUROSEMIDE 20 MG PO TABS: 20.0000 mg | ORAL_TABLET | Freq: Every day | ORAL | 3 refills | Status: AC | PRN

## 2024-02-14 NOTE — Patient Instructions (Addendum)
 Medication Instructions:  Your physician has recommended you make the following change in your medication:   CHANGE Furosemide (Lasix) to as needed for weight gain of 2 pounds overnight or 5 pounds in one week.   *If you need a refill on your cardiac medications before your next appointment, please call your pharmacy*  Lab Work: No changes at this time.   If you have labs (blood work) drawn today and your tests are completely normal, you will receive your results only by: MyChart Message (if you have MyChart) OR A paper copy in the mail If you have any lab test that is abnormal or we need to change your treatment, we will call you to review the results.  Testing/Procedures: None  Follow-Up: At [facility], you and your health needs are our priority.  As part of our continuing mission to provide you with exceptional heart care, our providers are all part of one team.  This team includes your primary Cardiologist (physician) and Advanced Practice Providers or APPs (Physician Assistants and Nurse Practitioners) who all work together to provide you with the care you need, when you need it.  Your next appointment:   3 month(s)  Provider:   Julien Nordmann, MD or Charlsie Quest, NP

## 2024-04-06 ENCOUNTER — Other Ambulatory Visit: Payer: Self-pay | Admitting: Family Medicine

## 2024-04-06 DIAGNOSIS — M5412 Radiculopathy, cervical region: Secondary | ICD-10-CM

## 2024-04-06 DIAGNOSIS — S22000A Wedge compression fracture of unspecified thoracic vertebra, initial encounter for closed fracture: Secondary | ICD-10-CM

## 2024-04-11 ENCOUNTER — Other Ambulatory Visit: Payer: Self-pay | Admitting: Cardiovascular Disease

## 2024-04-12 ENCOUNTER — Ambulatory Visit
Admission: RE | Admit: 2024-04-12 | Discharge: 2024-04-12 | Disposition: A | Discharge status: Home / Self Care | Source: Ambulatory Visit | Attending: Family Medicine | Admitting: Family Medicine

## 2024-04-12 ENCOUNTER — Ambulatory Visit
Admission: RE | Admit: 2024-04-12 | Discharge: 2024-04-12 | Discharge status: Home / Self Care | Source: Ambulatory Visit | Attending: Family Medicine | Admitting: Family Medicine

## 2024-04-12 DIAGNOSIS — S22000A Wedge compression fracture of unspecified thoracic vertebra, initial encounter for closed fracture: Secondary | ICD-10-CM

## 2024-04-12 DIAGNOSIS — M5412 Radiculopathy, cervical region: Secondary | ICD-10-CM

## 2024-05-02 NOTE — Progress Notes (Unsigned)
 Referring Physician:  Carlisle Benton CROME, FNP [address] [place],  [place] [address]  Primary Physician:  Sherial Bail, MD  History of Present Illness: 05/03/2024 Melinda Le is here today with a chief complaint of neck and mid back pain ongoing for 3 to 4 years..  She states that she has pain often in between her shoulder blades.  She also has numbness and tingling in her first 4 digits of her right hand, and first 3 digits of her left.  She does not feel as though she has had any increase in weakness.  One of her largest complaint today is ongoing dizziness.  She states she has had full workup from ENT and vascular previously feels as though might be related to her neck.  Denies any changes to her gait at this time.  No saddle anesthesia.   Duration: 3-4 years Severity:   Precipitating: aggravated by household chores  Modifying factors: made better by resting Weakness: none Timing: constant  Bowel/Bladder Dysfunction: none  Conservative measures:  Physical therapy: has not participated in PT for her neck about 2 years, but  Multimodal medical therapy including regular antiinflammatories: Tylenol, naproxen  Injections:  03/16/2024: Left L5-S1 transforaminal ESI 04/10/2020: Bilateral occipital nerve blocks and trigger point injections performed by Dr. Maree (good relief)   Past Surgery: no spinal surgeries    The symptoms are causing a significant impact on the patient's life.   Review of Systems:  A 10 point review of systems is negative, except for the pertinent positives and negatives detailed in the HPI.  Past Medical History: Past Medical History:  Diagnosis Date   Atypical chest pain    a. 2009 reportedly nl cath; b. 02/2015 Nl MV; c. 07/2015 Cath: nl cors [facility]).   Crohn's ileitis, without complications (HCC) 04/10/2018   Dizziness    a. 05/2019 Carotid U/S: Nl RICA. LICA 1-39%. Antegrade vertebral flow; b. 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 Apparent high-grade focal stenosis of petrous RICA and paraclinoid LICA. May be exagerrated by artifact.   Heart murmur    History of echocardiogram    a. 10/2016 Echo: EF 65%, no rwma, Gr1 DD. Triv to Mild MR, Triv TR.   Hypertension    Melanoma (HCC) 1998   Resected at [facility]   Stroke [facility])    a. 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    Past Surgical History: Past Surgical History:  Procedure Laterality Date   BASAL CELL CARCINOMA EXCISION     CARDIAC CATHETERIZATION     Pt doesn't recall date/no stents    Allergies: Allergies as of 05/03/2024 - Review Complete 05/03/2024  Allergen Reaction Noted   Adrenal cortex extract Other (See Comments) 01/19/2023   Penicillins Hives 04/29/2016    Medications: Outpatient Encounter Medications as of 05/03/2024  Medication Sig   acetaminophen (TYLENOL) 650 MG CR tablet Take 650 mg by mouth daily.   amLODipine  (NORVASC ) 5 MG tablet Take 1 tablet (5 mg total) by mouth 2 (two) times daily.   aspirin  EC 81 MG tablet Take 1 tablet (81 mg total) by mouth daily. Swallow whole.   cyanocobalamin 1000 MCG tablet Take 1,000 mcg by mouth daily.   fexofenadine (ALLEGRA) 180 MG tablet Take 180 mg by mouth daily.   folic acid (FOLVITE) 1 MG tablet Take 1 mg by mouth daily.   furosemide  (LASIX ) 20 MG tablet Take 1 tablet (20 mg total) by mouth daily as needed (  As needed for weight gain of 2 pounds overnight or 5 pounds in one week).   KRILL OIL PO Take 1 tablet by mouth daily at 12 noon.   lansoprazole (PREVACID) 15 MG capsule Take 15 mg by mouth daily as needed.   Magnesium Glycinate 665 MG CAPS Take by mouth in the morning and at bedtime.   metoprolol  succinate (TOPROL -XL) 25 MG 24 hr tablet Take 1 tablet by mouth once daily   naproxen sodium (ALEVE) 220 MG tablet Take 220 mg by mouth daily.   ondansetron  (ZOFRAN  ODT) 4 MG disintegrating tablet Take 1 tablet (4 mg total) by mouth every 8 (eight) hours as  needed for nausea or vomiting.   [DISCONTINUED] cyanocobalamin (VITAMIN B12) 1000 MCG/ML injection Inject 1,000 mcg into the muscle every 30 (thirty) days. (Patient not taking: Reported on 02/14/2024)   [DISCONTINUED] ezetimibe  (ZETIA ) 10 MG tablet Take 1 tablet (10 mg total) by mouth daily.   [DISCONTINUED] rosuvastatin  (CRESTOR ) 5 MG tablet Take 1 tablet (5 mg total) by mouth every Monday, Wednesday, and Friday.   No facility-administered encounter medications on file as of 05/03/2024.    Social History: Social History   Tobacco Use   Smoking status: Never   Smokeless tobacco: Never  Vaping Use   Vaping status: Never Used  Substance Use Topics   Alcohol use: Yes    Alcohol/week: 1.0 standard drink of alcohol    Types: 1 Glasses of wine per week    Comment: occasional   Drug use: Never    Family Medical History: Family History  Problem Relation Age of Onset   Heart Problems Mother    Breast cancer Neg Hx     Physical Examination: @VITALWITHPAIN @  General: Patient is well developed, well nourished, calm, collected, and in no apparent distress. Attention to examination is appropriate.  Psychiatric: Patient is non-anxious.  Head:  Pupils equal, round, and reactive to light.  ENT:  Oral mucosa appears well hydrated.  Neck:   Supple.   Respiratory: Patient is breathing without any difficulty.  Extremities: No edema.  Vascular: Palpable dorsal pedal pulses.  Skin:   On exposed skin, there are no abnormal skin lesions.  NEUROLOGICAL:     Awake, alert, oriented to person, place, and time.  Speech is clear and fluent. Fund of knowledge is appropriate.   Cranial Nerves: Pupils equal round and reactive to light.  Facial tone is symmetric.   ROM of spine: Some increased tenderness to her cervical paraspinals.  Positive carpal tunnel syndrome provocative maneuvers. Positive Spurling's  Strength: Side Biceps Triceps Deltoid Interossei Grip Wrist Ext. Wrist Flex.  R 5 5  5 5 5 5 5   L 5 5 5 5 5 5 5    Side Iliopsoas Quads Hamstring PF DF EHL  R 5 5 5 5 5 5   L 5 5 5 5 5 5    Reflexes are 2+ and symmetric at the biceps, triceps, brachioradialis.   Hoffman's is absent.  Bilateral upper and lower extremity sensation is intact to light touch.    Gait is normal.   No difficulty with tandem gait.   No evidence of dysmetria noted.  Medical Decision Making  Imaging: Narrative & Impression  CLINICAL DATA:  Initial evaluation for cervical radiculitis.   EXAM: MRI CERVICAL SPINE WITHOUT CONTRAST   TECHNIQUE: Multiplanar, multisequence MR imaging of the cervical spine was performed. No intravenous contrast was administered.   COMPARISON:  Comparison made with previous MRI from 06/25/2020.   FINDINGS: Alignment:  Straightening of the normal cervical lordosis. Trace facet mediated anterolisthesis of C7 on T1.   Vertebrae: Vertebral body height maintained without acute or chronic fracture. Bone marrow signal intensity overall within normal limits. No worrisome osseous lesions. Mild degenerative reactive endplate changes present about the C4-5 through C6-7 interspaces. No other abnormal marrow edema.   Cord: Normal signal and morphology.   Posterior Fossa, vertebral arteries, paraspinal tissues: Unremarkable.   Disc levels:   C2-C3: Normal interspace. Bilateral facet degeneration with chronic bony ankylosis. No canal or foraminal stenosis.   C3-C4: Minimal disc bulge with bilateral uncovertebral spurring, greater on the right. Mild to moderate left greater than right facet hypertrophy. No spinal stenosis. Foramina remain adequately patent. Appearance is similar.   C4-C5: Degenerative intervertebral disc space narrowing with diffuse disc osteophyte complex, asymmetric to the right. Flattening and partial effacement of the ventral thecal sac. Superimposed moderate right worse than left facet hypertrophy with ligament flavum thickening. Resultant  moderate spinal stenosis. Moderate to severe bilateral C5 foraminal stenosis. Changes are progressed from prior.   C5-C6: Degenerate intervertebral disc space narrowing with circumferential disc osteophyte complex. Flattening of the ventral thecal sac. Mild bilateral facet hypertrophy. Mild spinal stenosis. Moderate to severe left with mild right C6 foraminal stenosis. Changes are mildly progressed from prior.   C6-C7: Degenerative intervertebral disc space narrowing with circumferential disc osteophyte complex. Left greater than right facet hypertrophy. Mild spinal stenosis. Severe left C7 foraminal stenosis. Right neural foramina remains patent. Changes are mildly progressed.   C7-T1: Trace anterolisthesis. No significant disc bulge. Moderate bilateral facet hypertrophy. Perineural cyst noted at the right neural foramen. No spinal stenosis. Mild to moderate bilateral C8 foraminal narrowing.   IMPRESSION: 1. 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 Overall, changes are mildly progressed as compared to previous MRI from 06/25/2020. 2. Multifactorial degenerative changes with resultant multilevel foraminal narrowing as above. Notable findings include moderate to severe bilateral C5 foraminal stenosis, with moderate to severe left C6 and C7 foraminal narrowing.       EXAM: MRI THORACIC SPINE WITHOUT CONTRAST   TECHNIQUE: Multiplanar, multisequence MR imaging of the thoracic spine was performed. No intravenous contrast was administered.   COMPARISON:  None Available.   FINDINGS: Alignment: Dextroscoliosis. Associated rotatory component noted. No listhesis.   Vertebrae: Vertebral body height maintained without acute or chronic fracture. Chronic endplate Schmorl's node deformities present at the inferior endplates of T11 through L1. Bone marrow signal intensity overall within normal limits. No worrisome  osseous lesions. Scattered degenerative reactive endplate changes noted, most pronounced at T7-8, T9-10, T11-12, and T12-L1. No other abnormal marrow edema.   Cord:  Normal signal and morphology.   Paraspinal and other soft tissues: Paraspinous soft tissues within normal limits. 7 mm simple cyst present at the upper pole of the right kidney, benign in appearance, no follow-up imaging recommended.   Disc levels:   T1-2: Negative interspace. Mild right-sided facet hypertrophy. No spinal stenosis. Foramina remain patent.   T2-3: Right paracentral disc protrusion mildly flattens the right ventral thecal sac. Mild bilateral facet hypertrophy. No significant spinal stenosis. Foramina remain patent.   T3-4: Negative interspace. Right-sided facet degeneration. No spinal stenosis. Mild right foraminal narrowing. Left neural foramina remains patent.   T4-5: Negative interspace. Mild right-sided facet hypertrophy. No significant spinal stenosis. Mild right foraminal narrowing. Left neural foramen remains patent.   T5-6: Minimal disc bulge. Mild right-sided facet hypertrophy. No significant canal or foraminal stenosis.  T6-7: Minimal disc bulge. Mild right-sided facet hypertrophy. No significant canal or foraminal stenosis.   T7-8: Small right paracentral disc protrusion flattens the ventral thecal sac. No significant spinal stenosis. Foramina remain patent.   T8-9: Minimal annular disc bulge with reactive endplate spurring. No spinal stenosis. Foramina remain patent.   T9-10: Disc bulge with tiny right paracentral disc protrusion. Mild facet hypertrophy. No spinal stenosis. Mild right foraminal narrowing. Left neural foramen remains patent.   T10-11: Mild disc bulge. Left greater than right facet hypertrophy. No spinal stenosis. Mild right foraminal narrowing. Left neural foramina remains patent.   T11-12: Mild disc bulge. Chronic endplate Schmorl's node deformity. Mild facet  hypertrophy. No spinal stenosis. Mild right foraminal narrowing. Left neural foramina remains patent.   T12-L1: Mild disc bulge. Chronic endplate Schmorl's node deformity. Mild facet degeneration. No significant spinal stenosis. Foramina remain patent.   IMPRESSION: 1. No acute or recent compression fracture within the thoracic spine. 2. Dextroscoliosis with associated multilevel thoracic spondylosis and facet hypertrophy as above. No significant spinal stenosis. Mild multilevel right-sided foraminal narrowing as detailed above.   FINDINGS: MRI HEAD FINDINGS   Brain: The coronal acquired T2 weighted imaging is motion degraded. No restricted diffusion is demonstrated to suggest acute or recent subacute infarction. There is a tiny chronic left parietal lobe cortical infarct which in retrospect was present on prior head CT 04/08/2019 (series 24, image 41). A few additional small scattered signal changes within the bilateral cerebral white matter are nonspecific, but are consistent with minimal chronic small vessel ischemic disease. No evidence of intracranial mass or intracranial hemorrhage. No midline shift or extra-axial collection. Cerebral volume is age appropriate.   Vascular: Reported separately   Skull and upper cervical spine: Normal marrow signal.   Sinuses/Orbits: Left lens replacement. The imaged globes and orbits are otherwise unremarkable. Mild ethmoid sinus mucosal thickening. No significant mastoid effusion.   MRA HEAD FINDINGS   The bilateral intracranial internal carotid arteries are patent. 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 Apparent mild focal stenosis of the petrous left internal carotid artery. Apparent mild segmental narrowing of the bilateral cavernous internal carotid arteries. Apparent moderate focal stenosis of the paraclinoid left internal carotid artery. The bilateral middle and anterior  cerebral arteries are patent without significant proximal stenosis. No anterior circulation aneurysm is identified.   The visualized distal vertebral arteries and basilar artery are patent without significant stenosis. The distal left vertebral artery is slightly dominant. The bilateral posterior cerebral arteries are patent without significant proximal stenosis. A right posterior communicating artery is poorly delineated and may be hypoplastic or absent. A left posterior communicating artery is present. No posterior circulation aneurysm is identified.   These results will be called to the ordering clinician or representative by the Radiologist Assistant, and communication documented in the PACS or zVision Dashboard.   IMPRESSION: MRI BRAIN:   - No evidence of intracranial mass or acute intracranial abnormality on this noncontrast brain MRI.   - Tiny chronic left parietal lobe cortical infarct, which in retrospect was present on prior head CT 04/08/2019.   - Minimal scattered signal changes within the cerebral white matter consistent with chronic small vessel ischemic disease.   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 The degree of stenosis may be exaggerated by susceptibility artifact arising from the skull base.   - Apparent moderate focal stenosis of the paraclinoid left internal carotid artery. This  stenosis may also be exaggerated by susceptibility artifact   - Additional apparent mild stenosis as described.   - No intracranial aneurysm is identified.    I have personally reviewed the images and agree with the above interpretation.  Assessment and Plan: Melinda Le is a pleasant 83 y.o. female is here today with a chief complaint of neck and mid back pain ongoing for 3 to 4 years..  She states that she has pain often in between her shoulder blades.  She also has numbness and tingling in her first 4 digits of  her right hand, and first 3 digits of her left.  She does not feel as though she has had any increase in weakness.  One of her largest complaint today is ongoing dizziness.  She states she has had full workup from ENT and vascular previously feels as though might be related to her neck.  On examination she has full strength.  Positive Spurling's.   Increased dizziness when extending her neck.  Most recent MRI of her cervical and thoracic spine was reviewed at length which does show moderate to severe bilateral C5 stenosis with moderate to severe left C6 and C7 foraminal narrowing and diffuse degenerative changes seen.  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  Was a pleasure to see patient in clinic today.  Unsure if patient's dizziness is due to her cervical stenosis.  However she does have significant degenerative changes present.  Plan moving forward includes:  - Flexion and extension x-rays to be completed today. - Referral to physical therapy - Carotid ultrasound bilaterally given 5 years since most recent imaging. - Plan for patient to see Dr. Claudene in clinic.  Will coordinate care.    Thank you for involving me in the care of this patient.   I spent a total of 60 minutes in both face-to-face and non-face-to-face activities for this visit on the date of this encounter including preparing to see the patient, obtaining and reviewing separate obtained history, perform medically appropriate examination, counseling the patient and her spouse, ordering additional tests, documenting clinical formation, independently interpreting results, and care coordination.  Lyle Decamp, PA-C Dept. of Neurosurgery

## 2024-05-03 ENCOUNTER — Inpatient Hospital Stay
Admission: RE | Admit: 2024-05-03 | Discharge: 2024-05-03 | Disposition: A | Discharge status: Home / Self Care | Payer: Self-pay | Source: Ambulatory Visit | Attending: Physician Assistant | Admitting: Physician Assistant

## 2024-05-03 ENCOUNTER — Ambulatory Visit
Admission: RE | Admit: 2024-05-03 | Discharge: 2024-05-03 | Disposition: A | Discharge status: Home / Self Care | Attending: Physician Assistant | Admitting: Physician Assistant

## 2024-05-03 ENCOUNTER — Ambulatory Visit
Admission: RE | Admit: 2024-05-03 | Discharge: 2024-05-03 | Disposition: A | Discharge status: Home / Self Care | Source: Ambulatory Visit | Attending: Physician Assistant | Admitting: Physician Assistant

## 2024-05-03 ENCOUNTER — Other Ambulatory Visit: Payer: Self-pay | Admitting: Family Medicine

## 2024-05-03 ENCOUNTER — Ambulatory Visit (INDEPENDENT_AMBULATORY_CARE_PROVIDER_SITE_OTHER): Admitting: Physician Assistant

## 2024-05-03 VITALS — BP 160/70 | Ht 60.5 in | Wt 141.0 lb

## 2024-05-03 DIAGNOSIS — M542 Cervicalgia: Secondary | ICD-10-CM | POA: Insufficient documentation

## 2024-05-03 DIAGNOSIS — M4802 Spinal stenosis, cervical region: Secondary | ICD-10-CM | POA: Diagnosis not present

## 2024-05-03 DIAGNOSIS — R42 Dizziness and giddiness: Secondary | ICD-10-CM | POA: Insufficient documentation

## 2024-05-03 DIAGNOSIS — M50323 Other cervical disc degeneration at C6-C7 level: Secondary | ICD-10-CM

## 2024-05-03 DIAGNOSIS — Z049 Encounter for examination and observation for unspecified reason: Secondary | ICD-10-CM

## 2024-05-10 ENCOUNTER — Ambulatory Visit
Admission: RE | Admit: 2024-05-10 | Discharge: 2024-05-10 | Disposition: A | Discharge status: Home / Self Care | Source: Ambulatory Visit | Attending: Physician Assistant | Admitting: Physician Assistant

## 2024-05-10 DIAGNOSIS — R42 Dizziness and giddiness: Secondary | ICD-10-CM | POA: Insufficient documentation

## 2024-05-10 DIAGNOSIS — M542 Cervicalgia: Secondary | ICD-10-CM | POA: Diagnosis present

## 2024-05-16 ENCOUNTER — Other Ambulatory Visit (HOSPITAL_COMMUNITY): Payer: Self-pay

## 2024-05-16 ENCOUNTER — Ambulatory Visit: Payer: Self-pay | Admitting: Physician Assistant

## 2024-05-16 ENCOUNTER — Ambulatory Visit: Attending: Cardiology | Admitting: Cardiology

## 2024-05-16 ENCOUNTER — Telehealth: Payer: Self-pay | Admitting: Pharmacy Technician

## 2024-05-16 ENCOUNTER — Encounter: Payer: Self-pay | Admitting: Cardiology

## 2024-05-16 VITALS — BP 162/78 | HR 71 | Ht 60.0 in | Wt 140.8 lb

## 2024-05-16 DIAGNOSIS — R0602 Shortness of breath: Secondary | ICD-10-CM | POA: Diagnosis present

## 2024-05-16 DIAGNOSIS — Z8673 Personal history of transient ischemic attack (TIA), and cerebral infarction without residual deficits: Secondary | ICD-10-CM | POA: Insufficient documentation

## 2024-05-16 DIAGNOSIS — R42 Dizziness and giddiness: Secondary | ICD-10-CM | POA: Diagnosis present

## 2024-05-16 DIAGNOSIS — E782 Mixed hyperlipidemia: Secondary | ICD-10-CM | POA: Diagnosis present

## 2024-05-16 DIAGNOSIS — I10 Essential (primary) hypertension: Secondary | ICD-10-CM | POA: Insufficient documentation

## 2024-05-16 DIAGNOSIS — I5032 Chronic diastolic (congestive) heart failure: Secondary | ICD-10-CM | POA: Diagnosis present

## 2024-05-16 MED ORDER — NEXLETOL 180 MG PO TABS
1.0000 | ORAL_TABLET | Freq: Every day | ORAL | 0 refills | Status: AC
Start: 2024-05-16 — End: 2024-08-14

## 2024-05-16 MED ORDER — NEXLETOL 180 MG PO TABS
1.0000 | ORAL_TABLET | Freq: Every day | ORAL | Status: AC
Start: 2024-05-16 — End: 2024-05-30

## 2024-05-16 NOTE — Progress Notes (Signed)
 Cardiology Office Note   Date:  05/16/2024  ID:  Melinda Le, DOB [birth year], MRN [id number] PCP: Sherial Bail, MD  [facility] Providers Cardiologist:  Evalene Lunger, MD     History of Present Illness Melinda Le is a 84 y.o. female with a past medical history of atypical chest pain (normal coronary arteries on catheterization in 2016), hypertension, hyperlipidemia, chronic dyspnea, who presents today for follow-up.   With a history of atypical chest pain with normal coronary arteries by left heart catheterization completed in 2016, hypertension, hyperlipidemia, Crohn's disease, melanoma, and chronic dizziness who was found to have a remote stroke on recent MRI.  Her dizziness dates back to May 2020 where she had undergone extensive neurological evaluation without clear cause being identified.  There was concern for prior stroke with significant intracranial and carotid disease.  However further workup at [facility] did not confirm the latter.  She underwent stress testing in 2020 Lexiscan  Myoview  which showed no evidence of significant ischemia, normal wall motion, with an EF estimated at 83%.  Presented to clinic in 06/2022 with complaints of shortness of breath previous outpatient echocardiogram revealed an LVEF of 60 to 65%, no RWMA, G2 DD, mild MR.  She was started on furosemide  20 mg daily.   She was seen in clinic by Dr. Gollan 02/03/2023.  She continued complaints of shortness of breath walking short distances.  She was started on furosemide  20 mg daily with improving shortness of breath.  She was concerned about her medications on return and she did stop taking Lipitor as she had heard the side effects of back pain.  Was concerned about Lasix  because of numbness in her hands and trigger finger and feels better taking Lasix  3 times a week.   She was last seen in clinic 02/14/2024.  She continued to have back pain as well as breast pain.  She stated that she had mammograms and  ultrasounds that have come back negative but continues to have her breast pain.  She states she did some research on the computer noted a diuretic could be causing some of her discomfort.  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  She previously been advised to take her furosemide  3 times weekly to decrease some of her symptoms that she was having and after discussion with her PCP decided to go back on furosemide  and daily with her amlodipine  dose related to her blood pressure.  Blood pressure was over 150 and she would take her amlodipine  if it was/she would hold on taking it.  There were no medication changes that were made or further testing that was ordered at the time.  She returns to clinic today accompanied by her husband.  She continues to have back pain as well as breast pain and L thigh pain and lower back pain.  She has undergone epidural injection to help with some of the symptoms but she states that there has been little benefit.  She has noted worsening shortness of breath with her regular activity at home.  She has stopped all statin medications due to myalgias.  Since she is intolerant to rosuvastatin  and ezetimibe .  She continues to take her furosemide  on a daily send she has noticed some ongoing shortness of breath.  She denies any hospitalizations or visits to the emergency department.  ROS: 10 point review of system has been  reviewed and considered negative with the exception ones been listed in the HPI  Studies Reviewed      2D echo 06/10/2022 1. Left ventricular ejection fraction, by estimation, is 60 to 65%. The  left ventricle has normal function. The left ventricle has no regional  wall motion abnormalities. Left ventricular diastolic parameters are  consistent with Grade II diastolic  dysfunction (pseudonormalization). The average left ventricular  global  longitudinal strain is -19.6 %.   2. Right ventricular systolic function is normal. The right ventricular  size is normal. There is mildly elevated pulmonary artery systolic  pressure. The estimated right ventricular systolic pressure is 41.7 mmHg.   3. The mitral valve is normal in structure. Mild mitral valve  regurgitation. No evidence of mitral stenosis.   4. The aortic valve is normal in structure. Aortic valve regurgitation is  not visualized. No aortic stenosis is present.   5. The inferior vena cava is normal in size with greater than 50%  respiratory variability, suggesting right atrial pressure of 3 mmHg.    Lexiscan  MPI 08/08/2019 Pharmacological myocardial perfusion imaging study with no significant  ischemia Normal wall motion, EF estimated at 83% No EKG changes concerning for ischemia at peak stress or in recovery. CT attenuation correction images with mild aortic atherosclerosis, no notable coronary calcification Low risk scan Risk Assessment/Calculations       Physical Exam VS:  BP (!) 162/78   Pulse 71   Ht 5' (1.524 m)   Wt 140 lb 12.8 oz (63.9 kg)   SpO2 97%   BMI 27.50 kg/m        Wt Readings from Last 3 Encounters:  05/16/24 140 lb 12.8 oz (63.9 kg)  05/03/24 141 lb (64 kg)  02/14/24 142 lb 9.6 oz (64.7 kg)    GEN: Well nourished, well developed in no acute distress NECK: No JVD; No carotid bruits CARDIAC: RRR, no murmurs, rubs, gallops RESPIRATORY:  Clear to auscultation without rales, wheezing or rhonchi  ABDOMEN: Soft, non-tender, non-distended EXTREMITIES: Trace pretibial edema; No deformity   ASSESSMENT AND PLAN Chronic dizziness that has been persistent for the last 5 years.  She has previously been evaluated by neurology extensively.  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  CT revealed no significant carotid disease.  She is continued on furosemide  and and other  antihypertensive medications.  She continues to follow with outpatient neurology.  History of CVA she has no deficits.  She is continued on aspirin  81 mg daily.  Unfortunately she was intolerant to rosuvastatin  or ezetimibe  and then self discontinued.  Chronic HFpEF with chronic shortness of breath with last echocardiogram revealing LVEF 60 to 65%, no RWMA, G2 DD, mildly elevated pulmonary artery systolic pressure in the setting of mild MR.  She continues with chronic shortness of breath that is unchanged.  Without any changes in her symptoms she has been continued on her furosemide  and.  Toprol -XL 25 mg daily.  She has been scheduled for an updated echocardiogram.  With her chronic dizziness SGLT2 inhibitors and MRA therapy has been deferred.   Primary hypertension with a blood pressure 162/78.  Blood pressure prior to her visit was 128 systolic.  Likely component of whitecoat hypertension.  She was continued on amlodipine  5 mg 2 tablets daily and Toprol -XL 25 mg daily and furosemide  20 mg daily.  She has been encouraged to continue to monitor blood pressures 1 to 2 hours postmedication  administration as well.  Mixed hyperlipidemia with LDL of 107 which is an improvement from March 2024 she was 147.  Unfortunately she was intolerant to the ezetimibe  nor the rosuvastatin .  She is given samples of Nexletol  to take 180 mg daily today if she tolerates we will send in a prescription.  She continues to decline PCSK9 inhibitor therapy after reading side effects of medication profile on the Internet.       Dispo: Patient is to return to clinic to see MD/APP in 3 months or sooner if needed for further evaluation.  Signed, Charle Mclaurin, NP

## 2024-05-16 NOTE — Telephone Encounter (Signed)
 Pharmacy Patient Advocate Encounter   Received notification from Fax that prior authorization for Nexletol  is required/requested.   Insurance verification completed.   The patient is insured through [employer] .   Per test claim: PA required; PA submitted to above mentioned insurance via CoverMyMeds Key/confirmation #/EOC [id number] Status is pending

## 2024-05-16 NOTE — Patient Instructions (Signed)
 Medication Instructions:  Your physician recommends the following medication changes.  START TAKING: Nexletol  180 mg daily  *If you need a refill on your cardiac medications before your next appointment, please call your pharmacy*  Lab Work: No labs ordered today  If you have labs (blood work) drawn today and your tests are completely normal, you will receive your results only by: MyChart Message (if you have MyChart) OR A paper copy in the mail If you have any lab test that is abnormal or we need to change your treatment, we will call you to review the results.  Testing/Procedures: Your physician has requested that you have an echocardiogram. Echocardiography is a painless test that uses sound waves to create images of your heart. It provides your doctor with information about the size and shape of your heart and how well your heart's chambers and valves are working.   You may receive an ultrasound enhancing agent through an IV if needed to better visualize your heart during the echo. This procedure takes approximately one hour.  There are no restrictions for this procedure.  This will take place at [address] [facility], [place] [address]  Please note: We ask at that you not bring children with you during ultrasound (echo/ vascular) testing. Due to room size and safety concerns, children are not allowed in the ultrasound rooms during exams. Our front office staff cannot provide observation of children in our lobby area while testing is being conducted. An adult accompanying a patient to their appointment will only be allowed in the ultrasound room at the discretion of the ultrasound technician under special circumstances. We apologize for any inconvenience.   Follow-Up: At [facility], you and your health needs are our priority.  As part of our continuing mission to provide you with exceptional heart care, our providers are all part of one team.  This  team includes your primary Cardiologist (physician) and Advanced Practice Providers or APPs (Physician Assistants and Nurse Practitioners) who all work together to provide you with the care you need, when you need it.  Your next appointment:   3 month(s)  Provider:   Timothy Gollan, MD or Tylene Lunch, NP

## 2024-05-16 NOTE — Telephone Encounter (Signed)
 Pharmacy Patient Advocate Encounter  Received notification from [employer] that Prior Authorization for Nexletol  has been APPROVED from 04/16/24 to 05/16/25. Ran test claim, Copay is $592.95- 3 months. This test claim was processed through [facility]- copay amounts may vary at other pharmacies due to pharmacy/plan contracts, or as the patient moves through the different stages of their insurance plan.   PA #/Case ID/Reference #: [id number]

## 2024-06-06 ENCOUNTER — Ambulatory Visit (INDEPENDENT_AMBULATORY_CARE_PROVIDER_SITE_OTHER): Admitting: Neurosurgery

## 2024-06-06 ENCOUNTER — Encounter: Payer: Self-pay | Admitting: Neurosurgery

## 2024-06-06 VITALS — BP 156/72 | Ht 60.0 in | Wt 140.0 lb

## 2024-06-06 DIAGNOSIS — M546 Pain in thoracic spine: Secondary | ICD-10-CM

## 2024-06-06 DIAGNOSIS — M2578 Osteophyte, vertebrae: Secondary | ICD-10-CM | POA: Diagnosis not present

## 2024-06-06 DIAGNOSIS — M47892 Other spondylosis, cervical region: Secondary | ICD-10-CM | POA: Diagnosis not present

## 2024-06-06 DIAGNOSIS — M542 Cervicalgia: Secondary | ICD-10-CM

## 2024-06-06 DIAGNOSIS — M4802 Spinal stenosis, cervical region: Secondary | ICD-10-CM

## 2024-06-06 DIAGNOSIS — R42 Dizziness and giddiness: Secondary | ICD-10-CM | POA: Diagnosis not present

## 2024-06-06 NOTE — Progress Notes (Signed)
 Referring Physician:  Sherial Bail, MD [address] [place],  [place] [address]  Primary Physician:  Sherial Bail, MD  History of Present Illness: 06/06/2024 [name] is here today with a chief complaint of neck and mid back pain ongoing for 3 to 4 years.  She is here today to follow-up on her cervical x-rays.  She was last seen by Lyle Decamp and concerns for possible cervicogenic headaches versus mobile cervical spondylolisthesis.  She states that she mostly gets a full feeling sensation in the head, she does not feel spinning sensation, does not feel unsteady.  But has this full pressure type sensation in her head.  This has been exacerbated after long car rides, however its gotten to the point where it is almost always present.  She is not having any pain radiating into her hands.  No pain radiating down her back or into her bilateral legs.  She has had an extensive workup.  Has also had a previous workup for occipital neuralgia with occipital nerve injections.  She has worked with ENT as well as vascular surgery and has not seen any middle ear or vascular issues that could be causing her symptoms.   Duration: 3-4 years Severity:   Precipitating: aggravated by household chores  Modifying factors: made better by resting Weakness: none Timing: constant  Bowel/Bladder Dysfunction: none  Conservative measures:  Physical therapy: has not participated in PT for her neck about 2 years, but  Multimodal medical therapy including regular antiinflammatories: Tylenol, naproxen  Injections:  03/16/2024: Left L5-S1 transforaminal ESI 04/10/2020: Bilateral occipital nerve blocks and trigger point injections performed by Dr. Maree, patient states that she did not get significant relief for any considerable amount of time with these injections.  Past Surgery: no spinal surgeries    The symptoms are causing a significant impact on the patient's life.   Review of  Systems:  A 10 point review of systems is negative, except for the pertinent positives and negatives detailed in the HPI.  Past Medical History: Past Medical History:  Diagnosis Date   Atypical chest pain    a. 2009 reportedly nl cath; b. 02/2015 Nl MV; c. 07/2015 Cath: nl cors [facility]).   Crohn's ileitis, without complications (HCC) 04/10/2018   Dizziness    a. 05/2019 Carotid U/S: Nl RICA. LICA 1-39%. Antegrade vertebral flow; b. 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 Apparent high-grade focal stenosis of petrous RICA and paraclinoid LICA. May be exagerrated by artifact.   Heart murmur    History of echocardiogram    a. 10/2016 Echo: EF 65%, no rwma, Gr1 DD. Triv to Mild MR, Triv TR.   Hypertension    Melanoma (HCC) 1998   Resect[facility] at [facility]   Stroke [facility])    a. 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    Past Surgical History: Past Surgical History:  Procedure Laterality Date   BASAL CELL CARCINOMA EXCISION     CARDIAC CATHETERIZATION     Pt doesn't recall date/no stents    Allergies: Allergies as of 06/06/2024 - Review Complete 06/06/2024  Allergen Reaction Noted   Adrenal cortex extract Other (See Comments) 01/19/2023   Penicillins Hives 04/29/2016   Rosuvastatin   05/16/2024   Zetia  [ezetimibe ]  05/16/2024    Medications: Outpatient Encounter Medications as of 06/06/2024  Medication Sig   amLODipine  (NORVASC ) 5 MG tablet Take 1 tablet (5 mg total) by mouth 2 (two) times daily.   aspirin  EC  81 MG tablet Take 1 tablet (81 mg total) by mouth daily. Swallow whole.   cyanocobalamin 1000 MCG tablet Take 1,000 mcg by mouth daily. (Patient taking differently: Take 1 tablet by mouth. Three times a week)   fexofenadine (ALLEGRA) 180 MG tablet Take 180 mg by mouth daily.   folic acid (FOLVITE) 1 MG tablet Take 1 mg by mouth daily.   furosemide  (LASIX ) 20 MG tablet Take 1 tablet (20 mg total) by mouth daily as needed (As needed for weight gain  of 2 pounds overnight or 5 pounds in one week).   KRILL OIL PO Take 1 tablet by mouth daily at 12 noon.   lansoprazole (PREVACID) 15 MG capsule Take 15 mg by mouth daily as needed.   Magnesium Glycinate 665 MG CAPS Take by mouth in the morning and at bedtime.   metoprolol  succinate (TOPROL -XL) 25 MG 24 hr tablet Take 1 tablet by mouth once daily   ondansetron  (ZOFRAN  ODT) 4 MG disintegrating tablet Take 1 tablet (4 mg total) by mouth every 8 (eight) hours as needed for nausea or vomiting.   acetaminophen (TYLENOL) 650 MG CR tablet Take 650 mg by mouth daily. (Patient not taking: Reported on 06/06/2024)   Bempedoic Acid  (NEXLETOL ) 180 MG TABS Take 1 tablet (180 mg total) by mouth daily.   naproxen sodium (ALEVE) 220 MG tablet Take 220 mg by mouth daily. (Patient not taking: Reported on 06/06/2024)   No facility-administered encounter medications on file as of 06/06/2024.    Social History: Social History   Tobacco Use   Smoking status: Never   Smokeless tobacco: Never  Vaping Use   Vaping status: Never Used  Substance Use Topics   Alcohol use: Yes    Alcohol/week: 1.0 standard drink of alcohol    Types: 1 Glasses of wine per week    Comment: occasional   Drug use: Never    Family Medical History: Family History  Problem Relation Age of Onset   Heart Problems Mother    Breast cancer Neg Hx     Physical Examination:  NEUROLOGICAL:     Awake, alert, oriented to person, place, and time.  Speech is clear and fluent. Fund of knowledge is appropriate.   Cranial Nerves: Pupils equal round and reactive to light.  Facial tone is symmetric.   ROM of spine: Some increased tenderness to her cervical paraspinals.  Positive carpal tunnel syndrome provocative maneuvers. Positive Spurling's  Strength: Side Biceps Triceps Deltoid Interossei Grip Wrist Ext. Wrist Flex.  R 5 5 5 5 5 5 5   L 5 5 5 5 5 5 5    Side Iliopsoas Quads Hamstring PF DF EHL  R 5 5 5 5 5 5   L 5 5 5 5 5 5     Reflexes are 2+ and symmetric at the biceps, triceps, brachioradialis.   Hoffman's is absent.  Bilateral upper and lower extremity sensation is intact to light touch.    Gait is normal.   No difficulty with tandem gait.   No evidence of dysmetria noted.  Medical Decision Making  Imaging: Narrative & Impression  CLINICAL DATA:  Initial evaluation for cervical radiculitis.   EXAM: MRI CERVICAL SPINE WITHOUT CONTRAST   TECHNIQUE: Multiplanar, multisequence MR imaging of the cervical spine was performed. No intravenous contrast was administered.   COMPARISON:  Comparison made with previous MRI from 06/25/2020.   FINDINGS: Alignment: Straightening of the normal cervical lordosis. Trace facet mediated anterolisthesis of C7 on T1.   Vertebrae: Vertebral  body height maintained without acute or chronic fracture. Bone marrow signal intensity overall within normal limits. No worrisome osseous lesions. Mild degenerative reactive endplate changes present about the C4-5 through C6-7 interspaces. No other abnormal marrow edema.   Cord: Normal signal and morphology.   Posterior Fossa, vertebral arteries, paraspinal tissues: Unremarkable.   Disc levels:   C2-C3: Normal interspace. Bilateral facet degeneration with chronic bony ankylosis. No canal or foraminal stenosis.   C3-C4: Minimal disc bulge with bilateral uncovertebral spurring, greater on the right. Mild to moderate left greater than right facet hypertrophy. No spinal stenosis. Foramina remain adequately patent. Appearance is similar.   C4-C5: Degenerative intervertebral disc space narrowing with diffuse disc osteophyte complex, asymmetric to the right. Flattening and partial effacement of the ventral thecal sac. Superimposed moderate right worse than left facet hypertrophy with ligament flavum thickening. Resultant moderate spinal stenosis. Moderate to severe bilateral C5 foraminal stenosis. Changes are progressed from  prior.   C5-C6: Degenerate intervertebral disc space narrowing with circumferential disc osteophyte complex. Flattening of the ventral thecal sac. Mild bilateral facet hypertrophy. Mild spinal stenosis. Moderate to severe left with mild right C6 foraminal stenosis. Changes are mildly progressed from prior.   C6-C7: Degenerative intervertebral disc space narrowing with circumferential disc osteophyte complex. Left greater than right facet hypertrophy. Mild spinal stenosis. Severe left C7 foraminal stenosis. Right neural foramina remains patent. Changes are mildly progressed.   C7-T1: Trace anterolisthesis. No significant disc bulge. Moderate bilateral facet hypertrophy. Perineural cyst noted at the right neural foramen. No spinal stenosis. Mild to moderate bilateral C8 foraminal narrowing.   IMPRESSION: 1. 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 Overall, changes are mildly progressed as compared to previous MRI from 06/25/2020. 2. Multifactorial degenerative changes with resultant multilevel foraminal narrowing as above. Notable findings include moderate to severe bilateral C5 foraminal stenosis, with moderate to severe left C6 and C7 foraminal narrowing.       EXAM: MRI THORACIC SPINE WITHOUT CONTRAST   TECHNIQUE: Multiplanar, multisequence MR imaging of the thoracic spine was performed. No intravenous contrast was administered.   COMPARISON:  None Available.   FINDINGS: Alignment: Dextroscoliosis. Associated rotatory component noted. No listhesis.   Vertebrae: Vertebral body height maintained without acute or chronic fracture. Chronic endplate Schmorl's node deformities present at the inferior endplates of T11 through L1. Bone marrow signal intensity overall within normal limits. No worrisome osseous lesions. Scattered degenerative reactive endplate changes noted, most pronounced at T7-8, T9-10,  T11-12, and T12-L1. No other abnormal marrow edema.   Cord:  Normal signal and morphology.   Paraspinal and other soft tissues: Paraspinous soft tissues within normal limits. 7 mm simple cyst present at the upper pole of the right kidney, benign in appearance, no follow-up imaging recommended.   Disc levels:   T1-2: Negative interspace. Mild right-sided facet hypertrophy. No spinal stenosis. Foramina remain patent.   T2-3: Right paracentral disc protrusion mildly flattens the right ventral thecal sac. Mild bilateral facet hypertrophy. No significant spinal stenosis. Foramina remain patent.   T3-4: Negative interspace. Right-sided facet degeneration. No spinal stenosis. Mild right foraminal narrowing. Left neural foramina remains patent.   T4-5: Negative interspace. Mild right-sided facet hypertrophy. No significant spinal stenosis. Mild right foraminal narrowing. Left neural foramen remains patent.   T5-6: Minimal disc bulge. Mild right-sided facet hypertrophy. No significant canal or foraminal stenosis.   T6-7: Minimal disc bulge. Mild right-sided facet hypertrophy. No significant canal or foraminal stenosis.   T7-8:  Small right paracentral disc protrusion flattens the ventral thecal sac. No significant spinal stenosis. Foramina remain patent.   T8-9: Minimal annular disc bulge with reactive endplate spurring. No spinal stenosis. Foramina remain patent.   T9-10: Disc bulge with tiny right paracentral disc protrusion. Mild facet hypertrophy. No spinal stenosis. Mild right foraminal narrowing. Left neural foramen remains patent.   T10-11: Mild disc bulge. Left greater than right facet hypertrophy. No spinal stenosis. Mild right foraminal narrowing. Left neural foramina remains patent.   T11-12: Mild disc bulge. Chronic endplate Schmorl's node deformity. Mild facet hypertrophy. No spinal stenosis. Mild right foraminal narrowing. Left neural foramina remains patent.    T12-L1: Mild disc bulge. Chronic endplate Schmorl's node deformity. Mild facet degeneration. No significant spinal stenosis. Foramina remain patent.   IMPRESSION: 1. No acute or recent compression fracture within the thoracic spine. 2. Dextroscoliosis with associated multilevel thoracic spondylosis and facet hypertrophy as above. No significant spinal stenosis. Mild multilevel right-sided foraminal narrowing as detailed above.   FINDINGS: MRI HEAD FINDINGS   Brain: The coronal acquired T2 weighted imaging is motion degraded. No restricted diffusion is demonstrated to suggest acute or recent subacute infarction. There is a tiny chronic left parietal lobe cortical infarct which in retrospect was present on prior head CT 04/08/2019 (series 24, image 41). A few additional small scattered signal changes within the bilateral cerebral white matter are nonspecific, but are consistent with minimal chronic small vessel ischemic disease. No evidence of intracranial mass or intracranial hemorrhage. No midline shift or extra-axial collection. Cerebral volume is age appropriate.   Vascular: Reported separately   Skull and upper cervical spine: Normal marrow signal.   Sinuses/Orbits: Left lens replacement. The imaged globes and orbits are otherwise unremarkable. Mild ethmoid sinus mucosal thickening. No significant mastoid effusion.   MRA HEAD FINDINGS   The bilateral intracranial internal carotid arteries are patent. 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 Apparent mild focal stenosis of the petrous left internal carotid artery. Apparent mild segmental narrowing of the bilateral cavernous internal carotid arteries. Apparent moderate focal stenosis of the paraclinoid left internal carotid artery. The bilateral middle and anterior cerebral arteries are patent without significant proximal stenosis. No anterior circulation aneurysm  is identified.   The visualized distal vertebral arteries and basilar artery are patent without significant stenosis. The distal left vertebral artery is slightly dominant. The bilateral posterior cerebral arteries are patent without significant proximal stenosis. A right posterior communicating artery is poorly delineated and may be hypoplastic or absent. A left posterior communicating artery is present. No posterior circulation aneurysm is identified.   These results will be called to the ordering clinician or representative by the Radiologist Assistant, and communication documented in the PACS or zVision Dashboard.   IMPRESSION: MRI BRAIN:   - No evidence of intracranial mass or acute intracranial abnormality on this noncontrast brain MRI.   - Tiny chronic left parietal lobe cortical infarct, which in retrospect was present on prior head CT 04/08/2019.   - Minimal scattered signal changes within the cerebral white matter consistent with chronic small vessel ischemic disease.   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 The degree of stenosis may be exaggerated by susceptibility artifact arising from the skull base.   - Apparent moderate focal stenosis of the paraclinoid left internal carotid artery. This stenosis may also be exaggerated by susceptibility artifact   - Additional apparent mild stenosis as described.   -  No intracranial aneurysm is identified.  Narrative & Impression  CLINICAL DATA:  neck pain   EXAM: CERVICAL SPINE - COMPLETE 4+ VIEW   COMPARISON:  Apr 06, 2024   FINDINGS: The cervical spine is visualized from C1-C7. Straightening the cervical lordosis without spondylolisthesis with flexion and extension views. Vertebral body heights are maintained: no evidence of acute fracture. Moderate to severe intervertebral disc space height loss C4-5 and C5-6. Moderate intervertebral disc space  height loss at C6-7. Multilevel facet arthropathy and uncovertebral hypertrophy. Osteopenia. No prevertebral soft tissue swelling. Visualized thorax is unremarkable.   IMPRESSION: Moderate to severe degenerative changes of the cervical spine most pronounced at C4-5 and C5-6.     Electronically Signed   By: Corean Salter M.D.   On: 05/11/2024 12:31    I have personally reviewed the images and agree with the above interpretation.  Assessment and Plan: Ms. Hagmann is a pleasant 83 y.o. female is here today with a chief complaint of neck and mid back pain ongoing for 3 to 4 years.  She states that her major complaint is posterior neck pain and a sensation of fullness in her head.  She has some concern whether or not this may be cervicogenic in origin.  She has been evaluated by ENT and vascular surgery who did not feel that she has any ear or vascular issues respectively.  On physical examination she does not show any signs of radiculopathy, however imaging does show significant disc height loss at multiple levels as well as some loss of cervical lordosis.  She does not show significant instability.  But she does show disc osteophyte complexes as well as facet hypertrophy and facet changes consistent with degenerative spondylosis.  I did discuss that with the amount of spondylosis that she has that there is a chance that she is getting cervicogenic changes, this often can create a sense of fullness or pressure mostly in the back of the head but can sometimes radiate up to the rest of the head.  She is not having signs of vertigo or unsteadiness, she does not seem to have any cerebellar issues.  She has not had cervical facet injections.  I like to reach out to her pain team to discuss whether or not they think she would benefit from cervical facet injections versus medial branch block type procedures.  I will plan on reaching out to the pain team.  At this point patient states that she would  not have any cervical spine surgery.  I did mention to her that she has a loss of disc height but the symptoms do not appear to be instability related nor does she have any clear radicular signs.  Is likely facet mediated if it is coming from the spine itself.   .  She states that she has pain often in between her shoulder blades.  She also has numbness and tingling in her first 4 digits of her right hand, and first 3 digits of her left.  She does not feel as though she has had any increase in weakness.  One of her largest complaint today is ongoing dizziness.  She states she has had full workup from ENT and vascular previously feels as though might be related to her neck.  On examination she has full strength.  Positive Spurling's.   Increased dizziness when extending her neck.  Most recent MRI of her cervical and thoracic spine was reviewed at length which does show moderate to severe bilateral C5  stenosis with moderate to severe left C6 and C7 foraminal narrowing and diffuse degenerative changes seen.  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  Penne MICAEL Sharps, MD Dept. of Neurosurgery  Spent a total of 30 minutes on her care today, this was spent evaluating her records, reading outside records, evaluating her imaging, face-to-face evaluation and coordinating her care going forward in addition to documentation.

## 2024-06-19 ENCOUNTER — Other Ambulatory Visit: Payer: Self-pay | Admitting: Family Medicine

## 2024-06-19 DIAGNOSIS — M5412 Radiculopathy, cervical region: Secondary | ICD-10-CM

## 2024-07-05 ENCOUNTER — Other Ambulatory Visit

## 2024-07-23 ENCOUNTER — Other Ambulatory Visit

## 2024-08-20 ENCOUNTER — Ambulatory Visit: Attending: Cardiovascular Disease

## 2024-08-20 ENCOUNTER — Ambulatory Visit: Admitting: Cardiology

## 2024-08-20 DIAGNOSIS — R0602 Shortness of breath: Secondary | ICD-10-CM | POA: Insufficient documentation

## 2024-08-20 LAB — ECHOCARDIOGRAM COMPLETE
AR max vel: 2.5 cm2
AV Area VTI: 2.1 cm2
AV Area mean vel: 2 cm2
AV Mean grad: 3 mmHg
AV Peak grad: 6.2 mmHg
Ao pk vel: 1.24 m/s
Area-P 1/2: 3.12 cm2
S' Lateral: 3.28 cm

## 2024-08-20 NOTE — Progress Notes (Deleted)
 Cardiology Office Note   Date:  08/20/2024  ID:  Melinda Le, DOB 01-07-1941, MRN [id number] PCP: Sherial Bail, MD  [facility] Providers Cardiologist:  Evalene Lunger, MD { Click to update primary MD,subspecialty MD or APP then REFRESH:1}    History of Present Illness Melinda Le is a 83 y.o. female with a past medical history of atypical chest pain (normal coronary arteries on catheterization in 2016), hypertension, hyperlipidemia, chronic dyspnea, who presents today for follow-up.   With a history of atypical chest pain with normal coronary arteries by left heart catheterization completed in 2016, hypertension, hyperlipidemia, Crohn's disease, melanoma, and chronic dizziness who was found to have a remote stroke on recent MRI.  Her dizziness dates back to May 2020 where she had undergone extensive neurological evaluation without clear cause being identified.  There was concern for prior stroke with significant intracranial and carotid disease.  However further workup at [facility] did not confirm the latter.  She underwent stress testing in 2020 Lexiscan  Myoview  which showed no evidence of significant ischemia, normal wall motion, with an EF estimated at 83%.  Presented to clinic in 06/2022 with complaints of shortness of breath previous outpatient echocardiogram revealed an LVEF of 60 to 65%, no RWMA, G2DD, mild MR.  She was started on furosemide  20 mg daily.   She was seen in clinic by Dr. Gollan 02/03/2023.  She continued complaints of shortness of breath walking short distances.  She was started on furosemide  20 mg daily with improving shortness of breath.  She was concerned about her medications on return and she did stop taking Lipitor as she had heard the side effects of back pain.  Was concerned about Lasix  because of numbness in her hands and trigger finger and feels better taking Lasix  3 times a week.  She was seen 02/14/2024.  She continued to have back pain as well as  breast pain.  She stated she had mammogram and ultrasound symptoms come back negative but continued to have breast pain.  She states she has researched in the computer and noted a diuretic because of her discomfort.  She reports that she continued to have shortness of breath and dyspnea on exertion with stressful things that she does during the day is probably mopping her floors.  She previously been about to take her furosemide  3 times weekly to decrease some of her symptoms but after discussion with her PCP decided to go back on furosemide  daily and her amlodipine  dose related to her blood pressure.   She was last seen in clinic 05/16/2024 accompanied by her husband.  She had complaints of back pain as well as breast pain and left thumb pain.  She had recently undergone epidural injection to help with some of her symptoms but states there have been little benefit.  She had noted some worsening shortness of breath with her regular activity at home.  She noted intolerance to rosuvastatin  and ezetimibe .  She did continue to take her furosemide  on a daily basis.  With ongoing shortness of breath she was scheduled for an updated echocardiogram.  She returns to clinic today     ROS: 10 point review of system has been reviewed and considered negative the exception was been listed in the HPI  Studies Reviewed     2D echo 06/10/2022 1. Left ventricular ejection fraction, by estimation, is 60 to 65%. The  left ventricle has normal function. The left ventricle has no regional  wall motion abnormalities. Left ventricular diastolic  parameters are  consistent with Grade II diastolic  dysfunction (pseudonormalization). The average left ventricular global  longitudinal strain is -19.6 %.   2. Right ventricular systolic function is normal. The right ventricular  size is normal. There is mildly elevated pulmonary artery systolic  pressure. The estimated right ventricular systolic pressure is 41.7 mmHg.   3. The  mitral valve is normal in structure. Mild mitral valve  regurgitation. No evidence of mitral stenosis.   4. The aortic valve is normal in structure. Aortic valve regurgitation is  not visualized. No aortic stenosis is present.   5. The inferior vena cava is normal in size with greater than 50%  respiratory variability, suggesting right atrial pressure of 3 mmHg.    Lexiscan  MPI 08/08/2019 Pharmacological myocardial perfusion imaging study with no significant  ischemia Normal wall motion, EF estimated at 83% No EKG changes concerning for ischemia at peak stress or in recovery. CT attenuation correction images with mild aortic atherosclerosis, no notable coronary calcification Low risk scan Risk Assessment/Calculations   No BP recorded.  {Refresh Note OR Click here to enter BP  :1}***       Physical Exam VS:  There were no vitals taken for this visit.       Wt Readings from Last 3 Encounters:  06/06/24 140 lb (63.5 kg)  05/16/24 140 lb 12.8 oz (63.9 kg)  05/03/24 141 lb (64 kg)    GEN: Well nourished, well developed in no acute distress NECK: No JVD; No carotid bruits CARDIAC: ***RRR, no murmurs, rubs, gallops RESPIRATORY:  Clear to auscultation without rales, wheezing or rhonchi  ABDOMEN: Soft, non-tender, non-distended EXTREMITIES:  No edema; No deformity   ASSESSMENT AND PLAN ***    {Are you ordering a CV Procedure (e.g. stress test, cath, DCCV, TEE, etc)?   Press F2        :[id number]}  Dispo: ***  Signed, Sharni Negron, NP

## 2024-08-22 ENCOUNTER — Ambulatory Visit: Payer: Self-pay | Admitting: Cardiology

## 2024-08-27 ENCOUNTER — Ambulatory Visit: Attending: Cardiology | Admitting: Cardiology

## 2024-08-27 ENCOUNTER — Encounter: Payer: Self-pay | Admitting: Cardiology

## 2024-08-27 VITALS — BP 150/60 | HR 78 | Ht 60.0 in | Wt 135.8 lb

## 2024-08-27 DIAGNOSIS — Z8673 Personal history of transient ischemic attack (TIA), and cerebral infarction without residual deficits: Secondary | ICD-10-CM | POA: Insufficient documentation

## 2024-08-27 DIAGNOSIS — I5032 Chronic diastolic (congestive) heart failure: Secondary | ICD-10-CM | POA: Diagnosis not present

## 2024-08-27 DIAGNOSIS — E782 Mixed hyperlipidemia: Secondary | ICD-10-CM | POA: Insufficient documentation

## 2024-08-27 DIAGNOSIS — R42 Dizziness and giddiness: Secondary | ICD-10-CM | POA: Insufficient documentation

## 2024-08-27 DIAGNOSIS — R0602 Shortness of breath: Secondary | ICD-10-CM | POA: Insufficient documentation

## 2024-08-27 DIAGNOSIS — I10 Essential (primary) hypertension: Secondary | ICD-10-CM | POA: Diagnosis present

## 2024-08-27 NOTE — Patient Instructions (Signed)
 Medication Instructions:  Your physician recommends that you continue on your current medications as directed. Please refer to the Current Medication list given to you today.   *If you need a refill on your cardiac medications before your next appointment, please call your pharmacy*  Lab Work: No labs ordered today  If you have labs (blood work) drawn today and your tests are completely normal, you will receive your results only by: MyChart Message (if you have MyChart) OR A paper copy in the mail If you have any lab test that is abnormal or we need to change your treatment, we will call you to review the results.  Testing/Procedures: No test ordered today   Follow-Up: At [facility], you and your health needs are our priority.  As part of our continuing mission to provide you with exceptional heart care, our providers are all part of one team.  This team includes your primary Cardiologist (physician) and Advanced Practice Providers or APPs (Physician Assistants and Nurse Practitioners) who all work together to provide you with the care you need, when you need it.  Your next appointment:   As needed   Provider:   You may see Timothy Gollan, MD or one of the following Advanced Practice Providers on your designated Care Team:   Lonni Meager, NP Lesley Maffucci, PA-C Bernardino Bring, PA-C Cadence [place], PA-C Tylene Lunch, NP Barnie Hila, NP

## 2024-08-27 NOTE — Progress Notes (Signed)
 6 Cardiology Office Note   Date:  08/27/2024  ID:  [name], [name] December 13, 1940, MRN [id number] PCP: Sherial Bail, MD  [facility] Providers Cardiologist:  Evalene Lunger, MD     History of Present Illness Melinda Le is a 83 y.o. female with a past medical history of atypical chest pain (normal coronary arteries on catheterization in 2016), hypertension, hyperlipidemia, chronic dyspnea, who presents today for follow-up.   With a history of atypical chest pain with normal coronary arteries by left heart catheterization completed in 2016, hypertension, hyperlipidemia, Crohn's disease, melanoma, and chronic dizziness who was found to have a remote stroke on recent MRI.  Her dizziness dates back to May 2020 where she had undergone extensive neurological evaluation without clear cause being identified.  There was concern for prior stroke with significant intracranial and carotid disease.  However further workup at [facility] did not confirm the latter.  She underwent stress testing in 2020 Lexiscan  Myoview  which showed no evidence of significant ischemia, normal wall motion, with an EF estimated at 83%.  Presented to clinic in 06/2022 with complaints of shortness of breath previous outpatient echocardiogram revealed an LVEF of 60 to 65%, no RWMA, G2DD, mild MR.  She was started on furosemide  20 mg daily.   She was seen in clinic by Dr. Gollan 02/03/2023.  She continued complaints of shortness of breath walking short distances.  She was started on furosemide  20 mg daily with improving shortness of breath.  She was concerned about her medications on return and she did stop taking Lipitor as she had heard the side effects of back pain.  Was concerned about Lasix  because of numbness in her hands and trigger finger and feels better taking Lasix  3 times a week.  She was seen 02/14/2024.  She continued to have back pain as well as breast pain.  She stated she had mammogram and ultrasound symptoms come  back negative but continued to have breast pain.  She states she has researched in the computer and noted a diuretic because of her discomfort.  She reports that she continued to have shortness of breath and dyspnea on exertion with stressful things that she does during the day is probably mopping her floors.  She previously been about to take her furosemide  3 times weekly to decrease some of her symptoms but after discussion with her PCP decided to go back on furosemide  daily and her amlodipine  dose related to her blood pressure.   She was last seen in clinic 05/16/2024 accompanied by her husband.  She had complaints of back pain as well as breast pain and left thumb pain.  She had recently undergone epidural injection to help with some of her symptoms but states there have been little benefit.  She had noted some worsening shortness of breath with her regular activity at home.  She noted intolerance to rosuvastatin  and ezetimibe .  She did continue to take her furosemide  on a daily basis.  With ongoing shortness of breath she was scheduled for an updated echocardiogram.  She returns to clinic today accompanied by her husband.  She continues to have chronic shortness of breath that is unchanged.  She states that she has been taking her furosemide  based off of her weight.  She states that there was a concern about her metoprolol  and she has been alternating taking a half a tablet and a full tablet as previously she was on a half a tablet and somewhere along the lines again changed to a  full tablet and she was unsure of the dose that she should be taking.  She states that she has been compliant with her current medication regimen.  She did bring a blood pressure log in today as she has always had a touch of whitecoat hypertension.  She denies any hospitalizations or visits to the emergency department.  ROS: 10 point review of system has been reviewed and considered negative the exception was been listed in the  HPI  Studies Reviewed EKG Interpretation Date/Time:  Monday August 27 2024 14:33:42 EDT Ventricular Rate:  78 PR Interval:  156 QRS Duration:  82 QT Interval:  360 QTC Calculation: 410 R Axis:   -3  Text Interpretation: Normal sinus rhythm Possible Left atrial enlargement When compared with ECG of 14-Feb-2024 10:23, No significant change was found Confirmed by Melinda Le ([id number]) on 08/27/2024 2:35:54 PM    2d echo 08/20/2024 1. Left ventricular ejection fraction, by estimation, is 60 to 65%. The  left ventricle has normal function. The left ventricle has no regional  wall motion abnormalities. Left ventricular diastolic parameters are  consistent with Grade I diastolic  dysfunction (impaired relaxation). The average left ventricular global  longitudinal strain is -18.9 %. The global longitudinal strain is normal.   2. Right ventricular systolic function is normal. The right ventricular  size is normal. There is normal pulmonary artery systolic pressure. The  estimated right ventricular systolic pressure is 27.1 mmHg.   3. The mitral valve is normal in structure. Mild mitral valve  regurgitation. No evidence of mitral stenosis.   4. The aortic valve is normal in structure. Aortic valve regurgitation is  not visualized. Aortic valve sclerosis is present, with no evidence of  aortic valve stenosis.   5. The inferior vena cava is normal in size with greater than 50%  respiratory variability, suggesting right atrial pressure of 3 mmHg.   2D echo 06/10/2022 1. Left ventricular ejection fraction, by estimation, is 60 to 65%. The  left ventricle has normal function. The left ventricle has no regional  wall motion abnormalities. Left ventricular diastolic parameters are  consistent with Grade II diastolic  dysfunction (pseudonormalization). The average left ventricular global  longitudinal strain is -19.6 %.   2. Right ventricular systolic function is normal. The right ventricular   size is normal. There is mildly elevated pulmonary artery systolic  pressure. The estimated right ventricular systolic pressure is 41.7 mmHg.   3. The mitral valve is normal in structure. Mild mitral valve  regurgitation. No evidence of mitral stenosis.   4. The aortic valve is normal in structure. Aortic valve regurgitation is  not visualized. No aortic stenosis is present.   5. The inferior vena cava is normal in size with greater than 50%  respiratory variability, suggesting right atrial pressure of 3 mmHg.    Lexiscan  MPI 08/08/2019 Pharmacological myocardial perfusion imaging study with no significant  ischemia Normal wall motion, EF estimated at 83% No EKG changes concerning for ischemia at peak stress or in recovery. CT attenuation correction images with mild aortic atherosclerosis, no notable coronary calcification Low risk scan Risk Assessment/Calculations     Physical Exam VS:  BP (!) 150/60 (BP Location: Left Arm, Patient Position: Sitting, Cuff Size: Normal)   Pulse 78 Comment: 75 oximeter  Ht 5' (1.524 m)   Wt 135 lb 12.8 oz (61.6 kg)   SpO2 99%   BMI 26.52 kg/m        Wt Readings from Last 3 Encounters:  08/27/24  135 lb 12.8 oz (61.6 kg)  06/06/24 140 lb (63.5 kg)  05/16/24 140 lb 12.8 oz (63.9 kg)    GEN: Well nourished, well developed in no acute distress NECK: No JVD; No carotid bruits CARDIAC: RRR, no murmurs, rubs, gallops RESPIRATORY:  Clear to auscultation without rales, wheezing or rhonchi  ABDOMEN: Soft, non-tender, non-distended EXTREMITIES:  No edema; No deformity   ASSESSMENT AND PLAN Chronic HFpEF with chronic shortness of breath with her echocardiogram revealed LVEF of 60 to 65%, no RWMA, G1 DD, and mild MR.  No findings to suggest her chronic shortness of breath.  She has been continued on furosemide  and Toprol -XL 25 mg daily.  With chronic dizziness we have deferred additional SGLT2 inhibitor or MRA therapy.   Primary hypertension with a blood  pressure today of 150/60 with home blood pressure log it is very well-controlled.  Significant component of whitecoat hypertension.  She is continued on amlodipine  5 mg 2 tablets daily and Toprol -XL 25 mg daily with furosemide  20 mg daily.  She has been encouraged to continue to monitor pressure 1 to 2 hours postmedication administration as well.  Mixed hyperlipidemia with an LDL of 107 which is an improvement from March 2024 where she was 147.  Unfortunately she was intolerant to ezetimibe  and rosuvastatin .  She was given samples of Nexletol  to take 100 mg daily if she tolerates that medicine prescription she continues to stick declined PCSK9 inhibitor therapy after reading the side effects unfortunately her insurance does not cover Nexletol .  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  She is continue aspirin  81 mg daily.  Unfortunately is intolerant of rosuvastatin  or ezetimibe  and is have discontinued both medications.  Chronic dizziness this been going on for several years.  Previously evaluated by neurology extensively.  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  Recommend continue with outpatient neurology follow-up.       Dispo: Patient to follow-up on an as needed basis as she is moving to [place]  and will need to establish cardiology care when she moves.  Signed, Athenia Rys, NP
